# Patient Record
Sex: Female | Born: 1980 | Race: White | Hispanic: Yes | Smoking: Never smoker
Health system: Southern US, Community
[De-identification: ages and names within clinical notes are randomized; demographics above are authoritative.]

## PROBLEM LIST (undated history)

## (undated) DIAGNOSIS — D649 Anemia, unspecified: Secondary | ICD-10-CM

## (undated) DIAGNOSIS — R87629 Unspecified abnormal cytological findings in specimens from vagina: Secondary | ICD-10-CM

## (undated) HISTORY — PX: CERVICAL BIOPSY: SHX590

## (undated) HISTORY — DX: Unspecified abnormal cytological findings in specimens from vagina: R87.629

## (undated) HISTORY — DX: Anemia, unspecified: D64.9

---

## 2004-05-03 LAB — SICKLE CELL SCREEN: Sickle Cell Screen: NORMAL

## 2005-08-22 ENCOUNTER — Emergency Department: Payer: Self-pay | Admitting: Emergency Medicine

## 2006-04-23 ENCOUNTER — Inpatient Hospital Stay: Payer: Self-pay

## 2007-12-10 HISTORY — PX: LEEP: SHX91

## 2008-06-22 ENCOUNTER — Ambulatory Visit: Payer: Self-pay

## 2008-09-14 ENCOUNTER — Ambulatory Visit: Payer: Self-pay

## 2008-10-02 ENCOUNTER — Emergency Department: Payer: Self-pay | Admitting: Emergency Medicine

## 2009-04-10 LAB — OB RESULTS CONSOLE RUBELLA ANTIBODY, IGM: Rubella: IMMUNE

## 2009-04-10 LAB — OB RESULTS CONSOLE VARICELLA ZOSTER ANTIBODY, IGG: Varicella: IMMUNE

## 2009-05-29 ENCOUNTER — Ambulatory Visit: Payer: Self-pay | Admitting: Family Medicine

## 2009-10-25 ENCOUNTER — Observation Stay: Payer: Self-pay

## 2009-10-28 ENCOUNTER — Inpatient Hospital Stay: Payer: Self-pay | Admitting: Obstetrics and Gynecology

## 2011-10-21 ENCOUNTER — Encounter: Payer: Self-pay | Admitting: Obstetrics and Gynecology

## 2012-02-14 ENCOUNTER — Observation Stay: Payer: Self-pay

## 2012-02-14 LAB — CBC WITH DIFFERENTIAL/PLATELET
Basophil %: 0.4 %
Eosinophil %: 4.4 %
HCT: 34.9 % — ABNORMAL LOW (ref 35.0–47.0)
HGB: 11.6 g/dL — ABNORMAL LOW (ref 12.0–16.0)
Lymphocyte #: 3.3 10*3/uL (ref 1.0–3.6)
Lymphocyte %: 26.4 %
MCH: 30.7 pg (ref 26.0–34.0)
MCHC: 33.3 g/dL (ref 32.0–36.0)
MCV: 92 fL (ref 80–100)
Neutrophil #: 7.7 10*3/uL — ABNORMAL HIGH (ref 1.4–6.5)
RBC: 3.78 10*6/uL — ABNORMAL LOW (ref 3.80–5.20)
RDW: 14.1 % (ref 11.5–14.5)

## 2012-02-14 LAB — URINALYSIS, COMPLETE
Glucose,UR: NEGATIVE mg/dL (ref 0–75)
Ketone: NEGATIVE
Leukocyte Esterase: NEGATIVE
Nitrite: NEGATIVE
Ph: 7 (ref 4.5–8.0)
Protein: NEGATIVE
RBC,UR: 1 /HPF (ref 0–5)
Squamous Epithelial: NONE SEEN
WBC UR: 1 /HPF (ref 0–5)

## 2012-02-15 ENCOUNTER — Ambulatory Visit: Payer: Self-pay | Admitting: Internal Medicine

## 2012-02-16 LAB — URINE CULTURE

## 2012-04-24 ENCOUNTER — Inpatient Hospital Stay: Payer: Self-pay | Admitting: Obstetrics & Gynecology

## 2012-04-24 LAB — CBC WITH DIFFERENTIAL/PLATELET
Basophil #: 0.2 10*3/uL — ABNORMAL HIGH (ref 0.0–0.1)
Basophil %: 1.7 %
Eosinophil %: 2.2 %
HCT: 38.5 % (ref 35.0–47.0)
Lymphocyte %: 31.8 %
MCH: 29.5 pg (ref 26.0–34.0)
MCHC: 33.2 g/dL (ref 32.0–36.0)
MCV: 89 fL (ref 80–100)
Monocyte #: 0.6 x10 3/mm (ref 0.2–0.9)
Platelet: 246 10*3/uL (ref 150–440)
RDW: 16.2 % — ABNORMAL HIGH (ref 11.5–14.5)
WBC: 10 10*3/uL (ref 3.6–11.0)

## 2015-04-18 NOTE — H&P (Signed)
L&D Evaluation:  History Expanded:   HPI 34 yo G4P3003 whose EDC = 5/25by 12 week US.  Pt presents with vaginal spotting and cramping since 6 am today.    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  HEENT, CNS, GI, GU, Respiratory, CV, Renal and Musculoskeletal systems were found to be normal.   Exam:   General no apparent distress    Mental Status clear    Chest clear    Heart normal sinus rhythm    Abdomen gravid, non-tender    Estimated Fetal Weight Average for gestational age    Pelvic deferred until after US    FHT normal rate with no decels    Ucx irregular   Impression:   Impression 3rd Trimester Spotting, contraction   Plan:   Comments Hydrate, pewlvic US   Electronic Signatures: Towana Badgerosenow, Bradlee Heitman J (MD)  (Signed 08-Mar-13 13:02)  Authored: L&D Evaluation   Last Updated: 08-Mar-13 13:02 by Towana Badgerosenow, Wiletta Bermingham J (MD)

## 2015-04-18 NOTE — H&P (Signed)
L&D Evaluation:  History Expanded:   HPI 34 yo G4P3003 whose EDC = 04/26/12.  Pt followed at [facility] fopr this pregnancy.  Pt presents in early labor    Blood Type A positive    Group B Strep Results (Result >5wks must be treated as unknown) negative    Maternal HIV Negative    Maternal Syphilis Ab Nonreactive    Rubella Results immune    EDC 26-Apr-2012    Presents with contractions    Patient's Medical History No Chronic Illness    Patient's Surgical History LEEP  (after last child)    Medications Pre [place]atal Vitamins  Tylenol (Acetaminophen)    Allergies NKDA    Social History none    Family History Non-Contributory    Exam:   General no apparent distress    Mental Status clear    Chest clear    Heart normal sinus rhythm    Abdomen gravid, non-tender    Estimated Fetal Weight Average for gestational age    Pelvic 4 cm    Mebranes AROM    Description clear    FHT normal rate with no decels    Ucx regular   Impression:   Impression early labor   Electronic Signatures: Towana Badgerosenow, Landon Bassford J (MD)  (Signed 17-May-13 03:34)  Authored: L&D Evaluation   Last Updated: 17-May-13 03:34 by Towana Badgerosenow, Bryn Perkin J (MD)

## 2016-01-09 ENCOUNTER — Emergency Department: Payer: Self-pay

## 2016-01-09 ENCOUNTER — Emergency Department
Admission: EM | Admit: 2016-01-09 | Discharge: 2016-01-10 | Disposition: A | Discharge status: Home / Self Care | Payer: Self-pay | Attending: Emergency Medicine | Admitting: Emergency Medicine

## 2016-01-09 ENCOUNTER — Encounter: Payer: Self-pay | Admitting: Emergency Medicine

## 2016-01-09 DIAGNOSIS — R200 Anesthesia of skin: Secondary | ICD-10-CM | POA: Insufficient documentation

## 2016-01-09 DIAGNOSIS — R531 Weakness: Secondary | ICD-10-CM | POA: Insufficient documentation

## 2016-01-09 DIAGNOSIS — R51 Headache: Secondary | ICD-10-CM | POA: Insufficient documentation

## 2016-01-09 DIAGNOSIS — H538 Other visual disturbances: Secondary | ICD-10-CM | POA: Insufficient documentation

## 2016-01-09 DIAGNOSIS — R079 Chest pain, unspecified: Secondary | ICD-10-CM | POA: Insufficient documentation

## 2016-01-09 DIAGNOSIS — R519 Headache, unspecified: Secondary | ICD-10-CM

## 2016-01-09 LAB — BASIC METABOLIC PANEL
Anion gap: 5 (ref 5–15)
BUN: 12 mg/dL (ref 6–20)
CO2: 26 mmol/L (ref 22–32)
CREATININE: 0.62 mg/dL (ref 0.44–1.00)
Calcium: 9.1 mg/dL (ref 8.9–10.3)
Chloride: 108 mmol/L (ref 101–111)
GFR calc Af Amer: >60 mL/min (ref >60)
GLUCOSE: 100 mg/dL — AB (ref 65–99)
Potassium: 4.1 mmol/L (ref 3.5–5.1)
SODIUM: 139 mmol/L (ref 135–145)

## 2016-01-09 LAB — CBC
HCT: 37.6 % (ref 35.0–47.0)
Hemoglobin: 12.7 g/dL (ref 12.0–16.0)
MCH: 29.6 pg (ref 26.0–34.0)
MCHC: 33.9 g/dL (ref 32.0–36.0)
MCV: 87.4 fL (ref 80.0–100.0)
PLATELETS: 341 10*3/uL (ref 150–440)
RBC: 4.3 MIL/uL (ref 3.80–5.20)
RDW: 13.8 % (ref 11.5–14.5)
WBC: 8 10*3/uL (ref 3.6–11.0)

## 2016-01-09 LAB — TROPONIN I: Troponin I: <0.03 ng/mL (ref <0.031)

## 2016-01-09 MED ORDER — METOCLOPRAMIDE HCL 5 MG/ML IJ SOLN
10.0000 mg | Freq: Once | INTRAMUSCULAR | Status: AC
  Administered 2016-01-10: 10 mg via INTRAVENOUS
  Filled 2016-01-09: qty 2

## 2016-01-09 MED ORDER — KETOROLAC TROMETHAMINE 30 MG/ML IJ SOLN
30.0000 mg | Freq: Once | INTRAMUSCULAR | Status: AC
  Administered 2016-01-10: 30 mg via INTRAVENOUS
  Filled 2016-01-09: qty 1

## 2016-01-09 MED ORDER — DIPHENHYDRAMINE HCL 50 MG/ML IJ SOLN
25.0000 mg | Freq: Once | INTRAMUSCULAR | Status: AC
  Administered 2016-01-10: 25 mg via INTRAVENOUS
  Filled 2016-01-09: qty 1

## 2016-01-09 NOTE — ED Notes (Addendum)
Patient presents to the ED for intermittent chest pain for several months that patient reports pain as a heaviness.  Patient states that the pain radiates up neck and into her head.  Patient denies shortness of breath and nausea or vomiting.  Patient reports feeling dizzy and lightheaded.  Patient states pain has been becoming more frequent and lasting longer.  Patient states when she feels the pain she lies on her left side and puts a "wasp venom ointment" on her left chest.  Patient has not been seen previously for this pain.  Patient states wasp venom ointment also contains rattlesnake venom.  (Interpreter # [id number])  Patient later reports "cloudy vision" for several days.

## 2016-01-09 NOTE — ED Notes (Addendum)
Pt states "when I comb my hair I can't do it strong enough because my heart hurts." Pt states once pain in chest was so bad that she was not able to stand up when she went to the bathroom. Pt states pain is a pressure and heaviness. Pt states she also has a headache. Pt states N&V yesterday, chills as well.

## 2016-01-09 NOTE — ED Provider Notes (Signed)
[facility] Emergency Department Provider Note  ____________________________________________  Time seen: Approximately 11:28 PM  I have reviewed the triage vital signs and the nursing notes.   HISTORY  Chief Complaint Chest Pain and Cloudy Vision    HPI Ayn Domangue is a 35 y.o. female who comes into the hospital today with headache and chest pain. The patient reports that she feels that whenever she moves her head she feels a pulsating. She reports that she also has some blurred vision. She also has been having some chest pain she reports it's been going on in to admit me for multiple months. The patient reports that the pain is in her left chest. She reports that she has pain whenever she raises her arm in an effort to brush her hair or when she tries to hold herself up. The patient reports that when she makes herself comfortable her pain seems to improve.The patient reports that she's had some nausea in the morning and then she's had pain on and off for the past few months. She also endorses some chills and the pain seemed to be worse in the morning and in the evenings. The patient has taken Tylenol for the pain and she reports that it helps but the pain returns. The patient denies any fever and reports that she has not gotten it checked out for this pain before. The patient does have some dizziness and lightheadedness as well. The patient is here to get checked out. She reports that her headache is 7/10 in intensity, but her chest pain at this time is 0/10   History reviewed. No pertinent past medical history.  There are no active problems to display for this patient.   Past Surgical History  Procedure Laterality Date  . Cervical biopsy      Current Outpatient Rx  Name  Route  Sig  Dispense  Refill  . butalbital-acetaminophen-caffeine (FIORICET) 50-325-40 MG tablet   Oral   Take 1-2 tablets by mouth every 8 (eight) hours as needed for headache.  20 tablet   0     Allergies Review of patient's allergies indicates no known allergies.  No family history on file.  Social History Social History  Substance Use Topics  . Smoking status: Never Smoker   . Smokeless tobacco: None  . Alcohol Use: No    Review of Systems Constitutional: No fever/chills Eyes: No visual changes. ENT: No sore throat. Cardiovascular:  chest pain. Respiratory: Denies shortness of breath. Gastrointestinal: Nausea with No abdominal pain.  no vomiting.  No diarrhea.  No constipation. Genitourinary: Negative for dysuria. Musculoskeletal: Negative for back pain. Skin: Negative for rash. Neurological:  headaches, focal weakness or numbness.  10-point ROS otherwise negative.  ____________________________________________   PHYSICAL EXAM:  VITAL SIGNS: ED Triage Vitals  Enc Vitals Group     BP 01/09/16 1749 131/85 mmHg     Pulse Rate 01/09/16 1749 73     Resp 01/09/16 1749 16     Temp 01/09/16 1749 99.1 F (37.3 C)     Temp Source 01/09/16 1749 Oral     SpO2 01/09/16 1749 100 %     Weight 01/09/16 1749 135 lb (61.236 kg)     Height 01/09/16 1749 4' 11.06" (1.5 m)     Head Cir --      Peak Flow --      Pain Score 01/09/16 1751 2     Pain Loc --      Pain Edu? --  Excl. in GC? --     Constitutional: Alert and oriented. Well appearing and in mild distress. Eyes: Conjunctivae are normal. PERRL. EOMI. Head: Atraumatic. Nose: No congestion/rhinnorhea. Mouth/Throat: Mucous membranes are moist.  Oropharynx non-erythematous. Cardiovascular: Normal rate, regular rhythm. Grossly normal heart sounds.  Good peripheral circulation. Respiratory: Normal respiratory effort.  No retractions. Lungs CTAB. Gastrointestinal: Soft and nontender. No distention. Positive bowel sounds Musculoskeletal: No lower extremity tenderness nor edema.   Neurologic:  Normal speech and language. Radial nerves II through XII are grossly intact with no focal motor or  neuro deficits Skin:  Skin is warm, dry and intact.  Psychiatric: Mood and affect are normal.   ____________________________________________   LABS (all labs ordered are listed, but only abnormal results are displayed)  Labs Reviewed  BASIC METABOLIC PANEL - Abnormal; Notable for the following:    Glucose, Bld 100 (*)    All other components within normal limits  CBC  TROPONIN I  TROPONIN I   ____________________________________________  EKG  ED ECG REPORT I, Rebecka Apley, the attending physician, personally viewed and interpreted this ECG.   Date: 01/09/2016  EKG Time: 1743  Rate: 86  Rhythm: normal sinus rhythm  Axis: normal  Intervals:none  ST&T Change: none  ____________________________________________  RADIOLOGY  CXR: No active cardiopulmonary disease  CT head: No acute intracranial abnormality. ____________________________________________   PROCEDURES  Procedure(s) performed: None  Critical Care performed: No  ____________________________________________   INITIAL IMPRESSION / ASSESSMENT AND PLAN / ED COURSE  Pertinent labs & imaging results that were available during my care of the patient were reviewed by me and considered in my medical decision making (see chart for details).  This is a 35 year old female who comes into the hospital today with some intermittent chest pain as well as some headache today. The patient reports that her headache is that she was worse today than her chest pain. She reports that when she combs her hair uses her arms sometimes she gets this pain in her chest. I will give the patient some Reglan as well as Benadryl and Toradol and send the patient for a CT of her head. I will repeat the patient's heart enzymes and our reassess the patient once I received the results.  The patient's headache is improved with the medication. The patient's repeat troponin is also unremarkable. I did explain this to the patient and  informed her that she should follow back up with her primary care physician for further evaluation of the symptoms. There is a possibility that some of this is musculoskeletal given the fact that she has chest pain when she moves her arms but I will have her follow-up. The patient at this time has no other pain or be discharged home. ____________________________________________   FINAL CLINICAL IMPRESSION(S) / ED DIAGNOSES  Final diagnoses:  Acute nonintractable headache, unspecified headache type  Chest pain, unspecified chest pain type      Rebecka Apley, MD 01/10/16 0246

## 2016-01-09 NOTE — ED Notes (Signed)
Pt transported to CT via stretcher.  

## 2016-01-09 NOTE — ED Notes (Signed)
Discussed need for CT with Dr. Pershing Proud.  Dr. Pershing Proud stated no need to order CT at this time.

## 2016-01-10 LAB — TROPONIN I

## 2016-01-10 MED ORDER — BUTALBITAL-APAP-CAFFEINE 50-325-40 MG PO TABS: 1.0000 | ORAL_TABLET | Freq: Three times a day (TID) | ORAL | Status: AC | PRN

## 2016-01-10 NOTE — Discharge Instructions (Signed)
Dolor de cabeza general sin causa (General Headache Without Cause) El dolor de cabeza es un dolor o malestar que se siente en la zona de la cabeza o del cuello. Puede no tener una causa especfica. Hay muchas causas y tipos de dolores de [place]. Los dolores de cabeza ms comunes son los siguientes:  Cefalea tensional.  Cefaleas migraosas.  Cefalea en brotes.  Cefaleas diarias crnicas. INSTRUCCIONES PARA EL CUIDADO EN EL HOGAR  Controle su afeccin para ver si hay cambios. Siga estos pasos para Scientist, physiological afeccin: Control del [employer] medicamentos de venta libre y los recetados solamente como se lo haya indicado el mdico.  Cuando sienta dolor de cabeza acustese en un cuarto oscuro y tranquilo.  Si se lo indican, aplique hielo sobre la cabeza y la zona del cuello:  Ponga el hielo en una bolsa plstica.  Coloque una toalla entre la piel y la bolsa de hielo.  Coloque el hielo durante , 2 a 3veces por Futures trader.  Utilice una almohadilla trmica o tome una ducha con agua caliente para aplicar calor en la cabeza y la zona del cuello como se lo haya indicado el mdico.  Mantenga las luces tenues si le [employer] luces brillantes o sus dolores de cabeza empeoran. Comida y bebida  Mantenga un horario para las comidas.  Limite el consumo de bebidas alcohlicas.  Consuma menos cantidad de cafena o deje de tomarla. Instrucciones generales  Concurra a todas las visitas de control como se lo haya indicado el mdico. Esto es importante.  Lleve un diario de los dolores de cabeza para Financial risk analyst qu factores pueden desencadenarlos. Por ejemplo, escriba los siguientes datos:  Lo que usted come y Estate agent.  Cunto tiempo duerme.  Algn cambio en su dieta o en los medicamentos.  Pruebe algunas tcnicas de relajacin, como los [place].  Limite el estrs.  Sintese con la espalda recta y no tense los msculos.  No consuma productos que contengan tabaco, incluidos  cigarrillos, tabaco de Theatre manager o cigarrillos electrnicos. Si necesita ayuda para dejar de fumar, consulte al mdico.  Haga actividad fsica habitualmente como se lo haya indicado el mdico.  Tenga un horario fijo para dormir. Duerma entre 7 y 9horas o la cantidad de horas que le haya recomendado el mdico. SOLICITE ATENCIN MDICA SI:   Los medicamentos no Materials engineer los sntomas.  Tiene un dolor de cabeza que es diferente del dolor de cabeza habitual.  Tiene nuseas o vmitos.  Tiene fiebre. SOLICITE ATENCIN MDICA DE INMEDIATO SI:   El dolor se hace cada vez ms intenso.  Ha vomitado repetidas veces.  Presenta rigidez en el cuello.  Sufre prdida de la visin.  Tiene problemas para hablar.  Siente dolor en el ojo o en el odo.  Presenta debilidad muscular o prdida del control muscular.  Pierde el equilibrio o tiene problemas para Advertising account planner.  Sufre mareos o se desmaya.  Se siente confundido.   Esta informacin no tiene Theme park manager el consejo del mdico. Asegrese de hacerle al mdico cualquier pregunta que tenga.   Document Released: 09/04/2005 Document Revised: 08/16/2015 Elsevier Interactive Patient Education 2016 [employer].  Dolor de pecho inespecfico  (Nonspecific Chest Pain) El dolor de pecho puede deberse a muchas enfermedades diferentes. Siempre existe una posibilidad de que el dolor est relacionado con algo grave, como un infarto de miocardio o un cogulo sanguneo en los pulmones. Hay muchas enfermedades que no son potencialmente mortales que pueden causar dolor de [place]. Si  tiene Engineer, mining de [place], es muy importante que se controle con el mdico. CAUSAS  Las causas del dolor de pecho pueden ser las siguientes:  Acidez estomacal.  Neumona o bronquitis.  Ansiedad o estrs.  Inflamacin de la zona que rodea al corazn (pericarditis) o a los pulmones (pleuritis o pleuresa).  Un cogulo sanguneo en el pulmn.  Colapso de un pulmn  (neumotrax), que puede aparecer de [employer] repentina por s solo (neumotrax espontneo) o debido a un traumatismo en el trax.  Culebrilla (virus de la varicela zster).  Infarto de miocardio.  Dao de los [place], los msculos y los cartlagos que conforman la pared torcica. Esto puede incluir lo siguiente:  Hematomas seos debido a lesiones.  Distensiones musculares o de los cartlagos por tos frecuente o repetida, o por exceso de trabajo.  Fractura de una o ms costillas.  Dolor de TEFL teacher debido a inflamacin (costocondritis). FACTORES DE RIESGO  Los factores de riesgo de tener dolor de pecho pueden incluir lo siguiente:  Actividades que incrementan el riesgo de sufrir traumatismos o lesiones en el trax.  Infecciones o enfermedades respiratorias que causan tos frecuente.  Enfermedades o [employer] comidas que pueden causar Engineering geologist.  Enfermedades cardacas o antecedentes familiares de enfermedades cardacas.  Enfermedades o comportamientos de salud que aumentan el riesgo de tener un cogulo sanguneo.  Haber tenido varicela (varicela zster). SIGNOS Y SNTOMAS El dolor de pecho puede provocar las siguientes sensaciones:  Ardor u hormigueo en la superficie o en lo profundo del pecho.  Dolor opresivo, continuo o constrictivo.  Dolor vago o intenso que empeora al [employer], toser o inhalar profundamente.  Dolor que tambin se siente en la espalda, el cuello, el hombro o el brazo, o dolor que se irradia a cualquiera de estas zonas. El dolor de pecho puede aparecer y Geneticist, molecular, o bien puede ser constante. DIAGNSTICO Gretchen Short se necesiten anlisis de laboratorio u otros estudios para Veterinary surgeon causa del Engineer, mining. El mdico puede indicarle que se haga una prueba llamada EGC (electrocadiograma) ambulatorio. El Regulatory affairs officer los patrones de los latidos cardacos en el momento en que se realiza el [place]. Tambin pueden hacerle otros estudios, por  ejemplo:  Ecocardiograma transtorcico (ETT). Durante el ecocardiograma, se usan ondas sonoras para crear una imagen de todas las estructuras cardacas y evaluar cmo circula la sangre por el corazn.  Ecocardiograma transesofgico (ETE).Este es un estudio de diagnstico por imgenes ms avanzado que el obtiene imgenes del interior del cuerpo. Le permite al mdico ver el corazn con mayor detalle.  Monitoreo cardaco. Permite que el mdico controle la frecuencia y el ritmo cardaco en tiempo real.  Monitor Holter. Es un dispositivo porttil que [employer] latidos del corazn y puede ayudar a Education administrator las arritmias cardacas. Le permite al [employer] registrar la actividad cardaca durante varios das, si es necesario.  Pruebas de esfuerzo. Estas pueden realizarse durante el ejercicio o mediante la administracin de un medicamento que acelera los latidos del corazn.  Anlisis de [place].  Diagnstico por imgenes. TRATAMIENTO  El tratamiento depende de la causa del dolor de [place]. El tratamiento puede incluir lo siguiente:  Medicamentos. Estos pueden incluir lo siguiente:  Inhibidores de Publishing copy.  Antiinflamatorios.  Analgsicos para las enfermedades inflamatorias.  Antibiticos, si hay una infeccin.  Medicamentos para [employer].  Medicamentos para tratar la enfermedad arterial coronaria.  Tratamiento complementario para las enfermedades que no requieren la toma de medicamentos. Esto puede incluir lo siguiente:  Descansar.  Aplicar compresas  fras o calientes en las zonas lesionadas.  Limitar las actividades hasta que [employer]. INSTRUCCIONES PARA EL CUIDADO EN EL HOGAR  Si le recetaron antibiticos, asegrese de terminarlos, incluso si comienza a sentirse mejor.  Evite las [employer] causen dolor de [place].  No consuma ningn producto que contenga tabaco, lo que incluye cigarrillos, tabaco de Theatre manager o Soil scientist. Si necesita ayuda para dejar de fumar, consulte al mdico.  No beba alcohol.  Tome los medicamentos solamente como se lo haya indicado el mdico.  Concurra a todas las visitas de control como se lo haya indicado el mdico. Esto es importante. Esto incluye otros estudios si el dolor de pecho no desaparece.  Si la acidez es la causa del dolor de [place], tal vez le aconsejen que mantenga la cabeza levantada (elevada) mientras duerme. Esto reduce la probabilidad de que el cido retroceda del estmago al esfago.  Haga cambios en su estilo de vida como se lo haya indicado el mdico. Estos pueden incluir lo siguiente:  Education administrator actividad fsica con regularidad. Pida al mdico que le sugiera algunas actividades que sean seguras para usted.  Consumir una dieta cardiosaludable. Un nutricionista matriculado puede ayudarlo a Software engineer saludables.  Mantener un peso saludable.  Controlar la diabetes, si es necesario.  Reducir las situaciones de estrs. SOLICITE ATENCIN MDICA SI:  El dolor de pecho no desaparece despus del tratamiento.  Tiene una erupcin cutnea con ampollas en el pecho.  Tiene fiebre. SOLICITE ATENCIN MDICA DE INMEDIATO SI:   El dolor en el pecho es ms intenso.  La tos empeora, o expectora sangre.  Siente un dolor abdominal intenso.  Siente debilidad intensa.  Se desmaya.  Tiene escalofros.  Tiene una molestia repentina e inexplicable en el pecho.  Tiene molestias repentinas e [employer], la espalda, el cuello o la [place].  Le falta el aire en cualquier momento.  Comienza a sudar de [place] repentina o la piel se le humedece.  Siente nuseas o vomita.  Se siente repentinamente mareado o se desmaya.  Siente que el corazn comienza a latir rpidamente o que se saltea latidos. Estos sntomas pueden representar un problema grave que constituye Radio broadcast assistant. No espere hasta que los sntomas desaparezcan. Solicite  atencin mdica de inmediato. Comunquese con el servicio de emergencias de su localidad (911 en los [place]). No conduzca por sus propios medios [employer].   Esta informacin no tiene Theme park manager el consejo del mdico. Asegrese de hacerle al mdico cualquier pregunta que tenga.   Document Released: 11/25/2005 Document Revised: 12/16/2014 Elsevier Interactive Patient Education [employer]

## 2017-02-11 IMAGING — CT CT HEAD W/O CM
2 series · 14 of 30 positions shown, 16 images · non-contrast
Comparison: None.

CLINICAL DATA: Headache and blurry vision.

EXAM:
CT HEAD WITHOUT CONTRAST
TECHNIQUE: Contiguous axial images were obtained from the base of the skull
through the vertex without intravenous contrast.

[Series 2: head wo · axial · 0.38mm/px · z∈[-168,-72]mm · 6 of 27 slices shown, 8 images]
[im 4/27  brain]
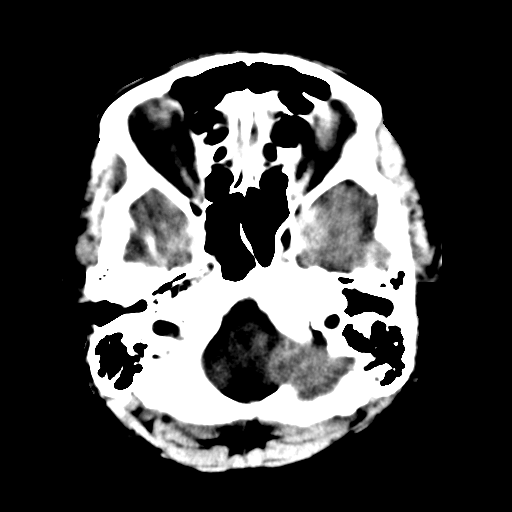
[im 4/27  bone]
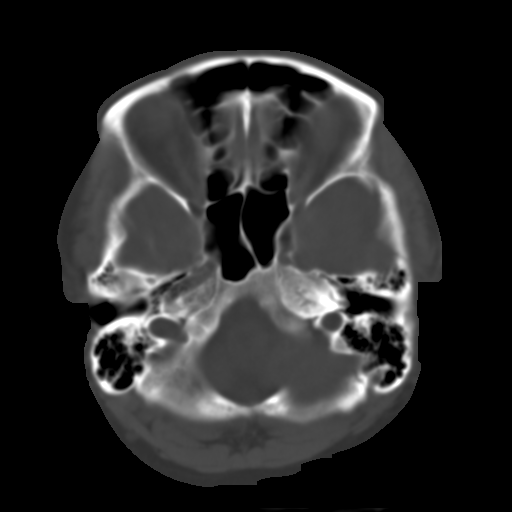
[im 8/27  brain]
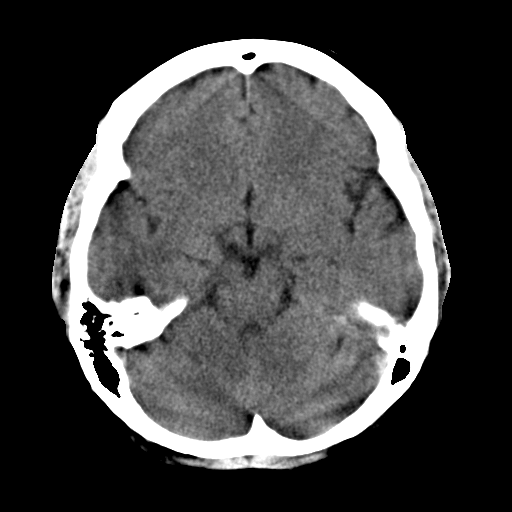
[im 12/27  brain]
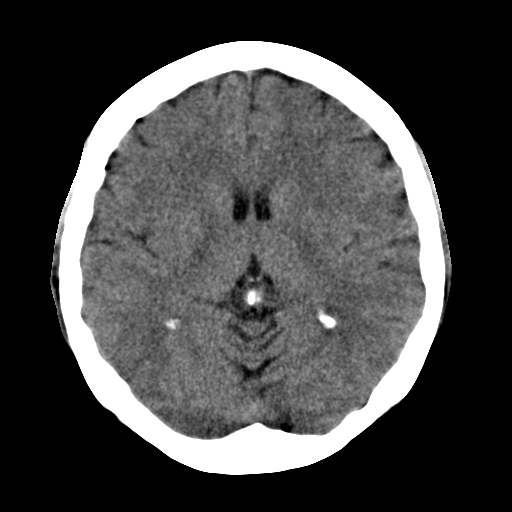
[im 15/27  brain]
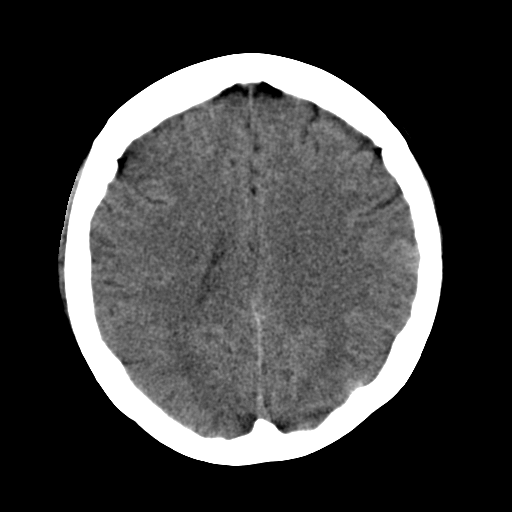
[im 19/27  brain]
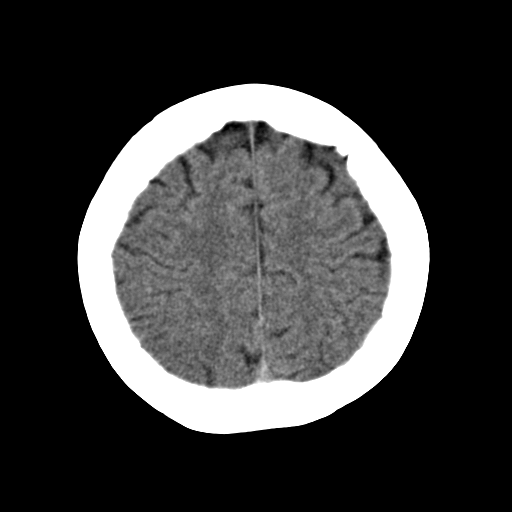
[im 19/27  bone]
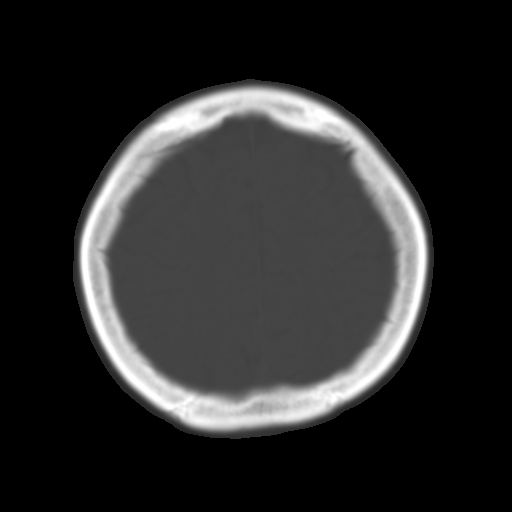
[im 23/27  brain]
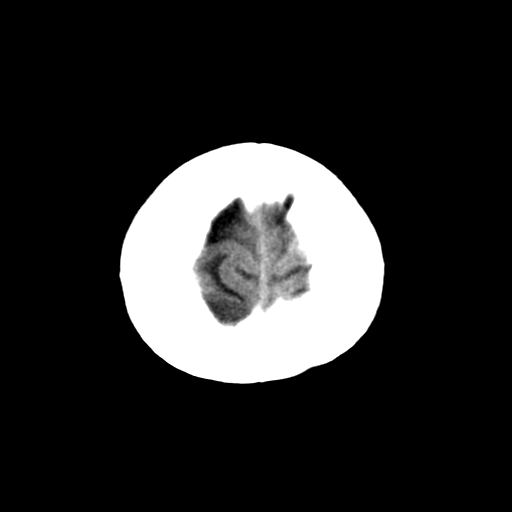

[Series 3: head bone · axial · 0.38mm/px · z∈[-179,-57]mm · 8 of 77 slices shown]
[im 8/77  bone]
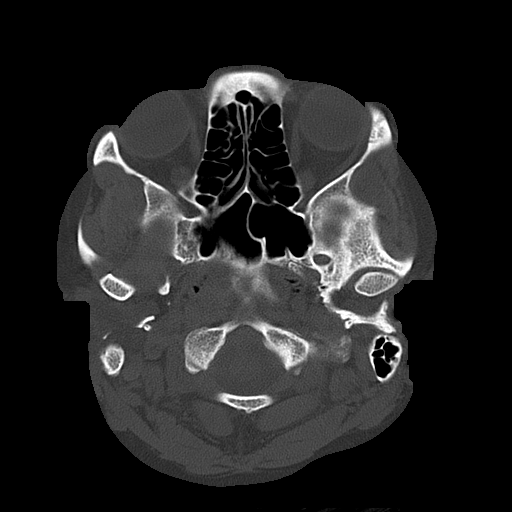
[im 15/77  bone]
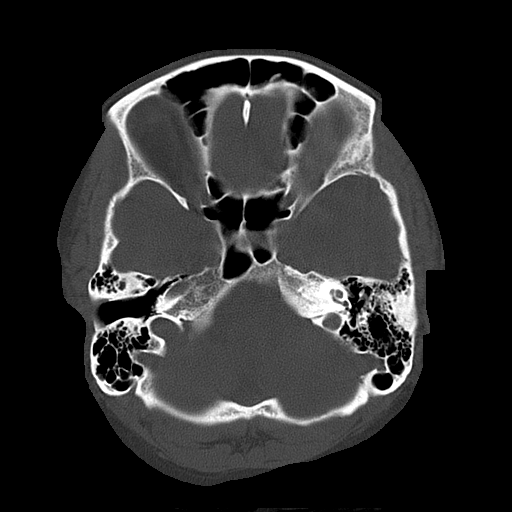
[im 26/77  bone]
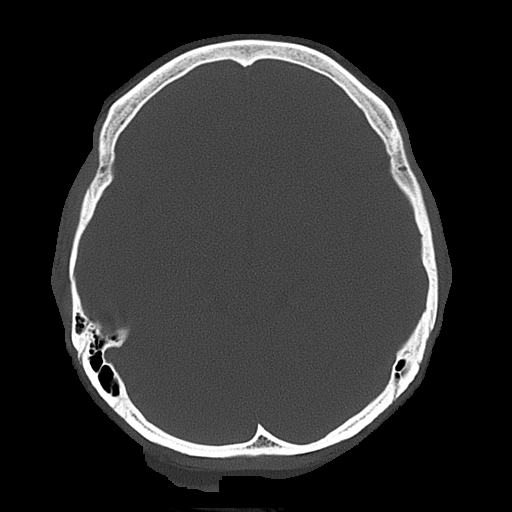
[im 33/77  bone]
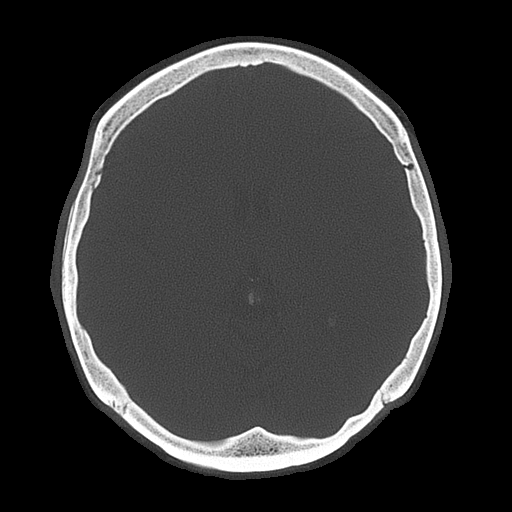
[im 44/77  bone]
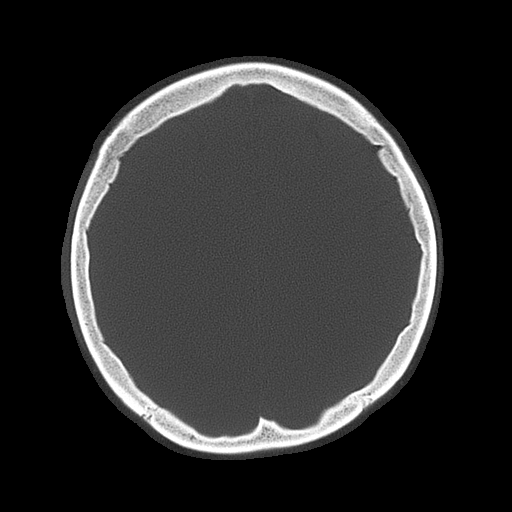
[im 51/77  bone]
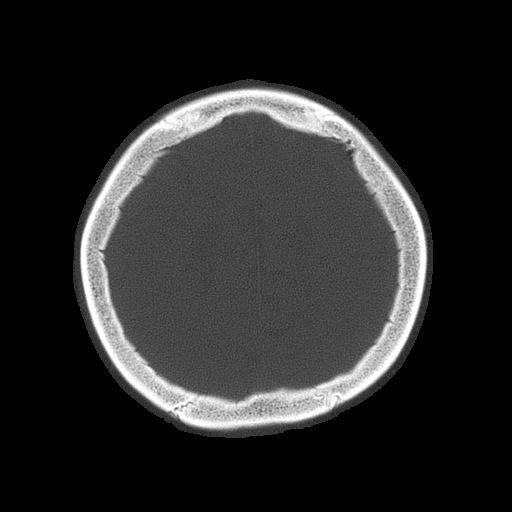
[im 62/77  bone]
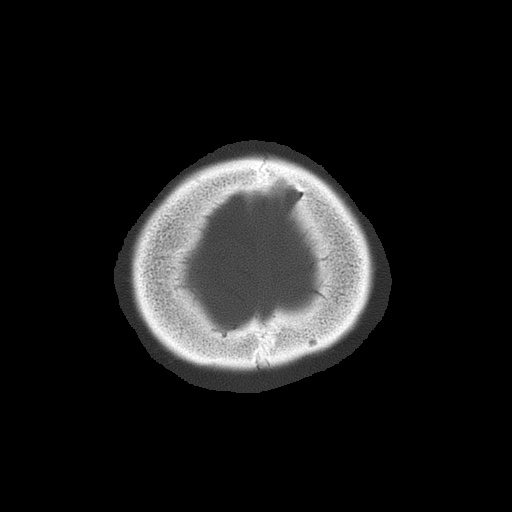
[im 69/77  bone]
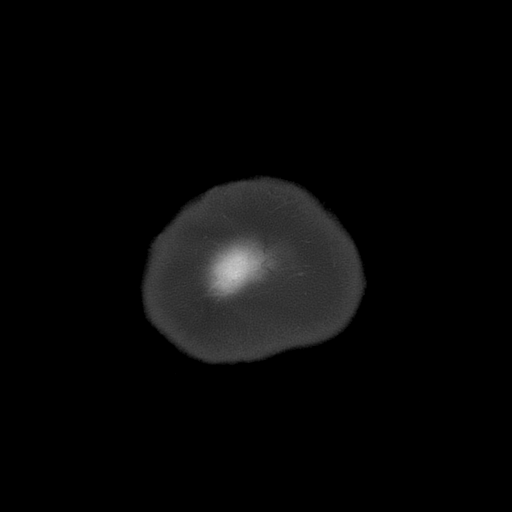

[14 of 30 positions shown; findings below may reference images not displayed]

FINDINGS: Mild streak artifact through the skullbase. No intracranial
hemorrhage, mass effect, or midline shift. No hydrocephalus. The
basilar cisterns are patent. No evidence of territorial infarct. No
intracranial fluid collection. Calvarium is intact. Included
paranasal sinuses and mastoid air cells are well aerated.
IMPRESSION: No acute intracranial abnormality.

## 2017-02-11 IMAGING — CR DG CHEST 2V
2 series · 2 of 2 positions shown · non-contrast
Comparison: None.

CLINICAL DATA: Intermittent chest pain for several months.

EXAM:
CHEST  2 VIEW

[chest pa]
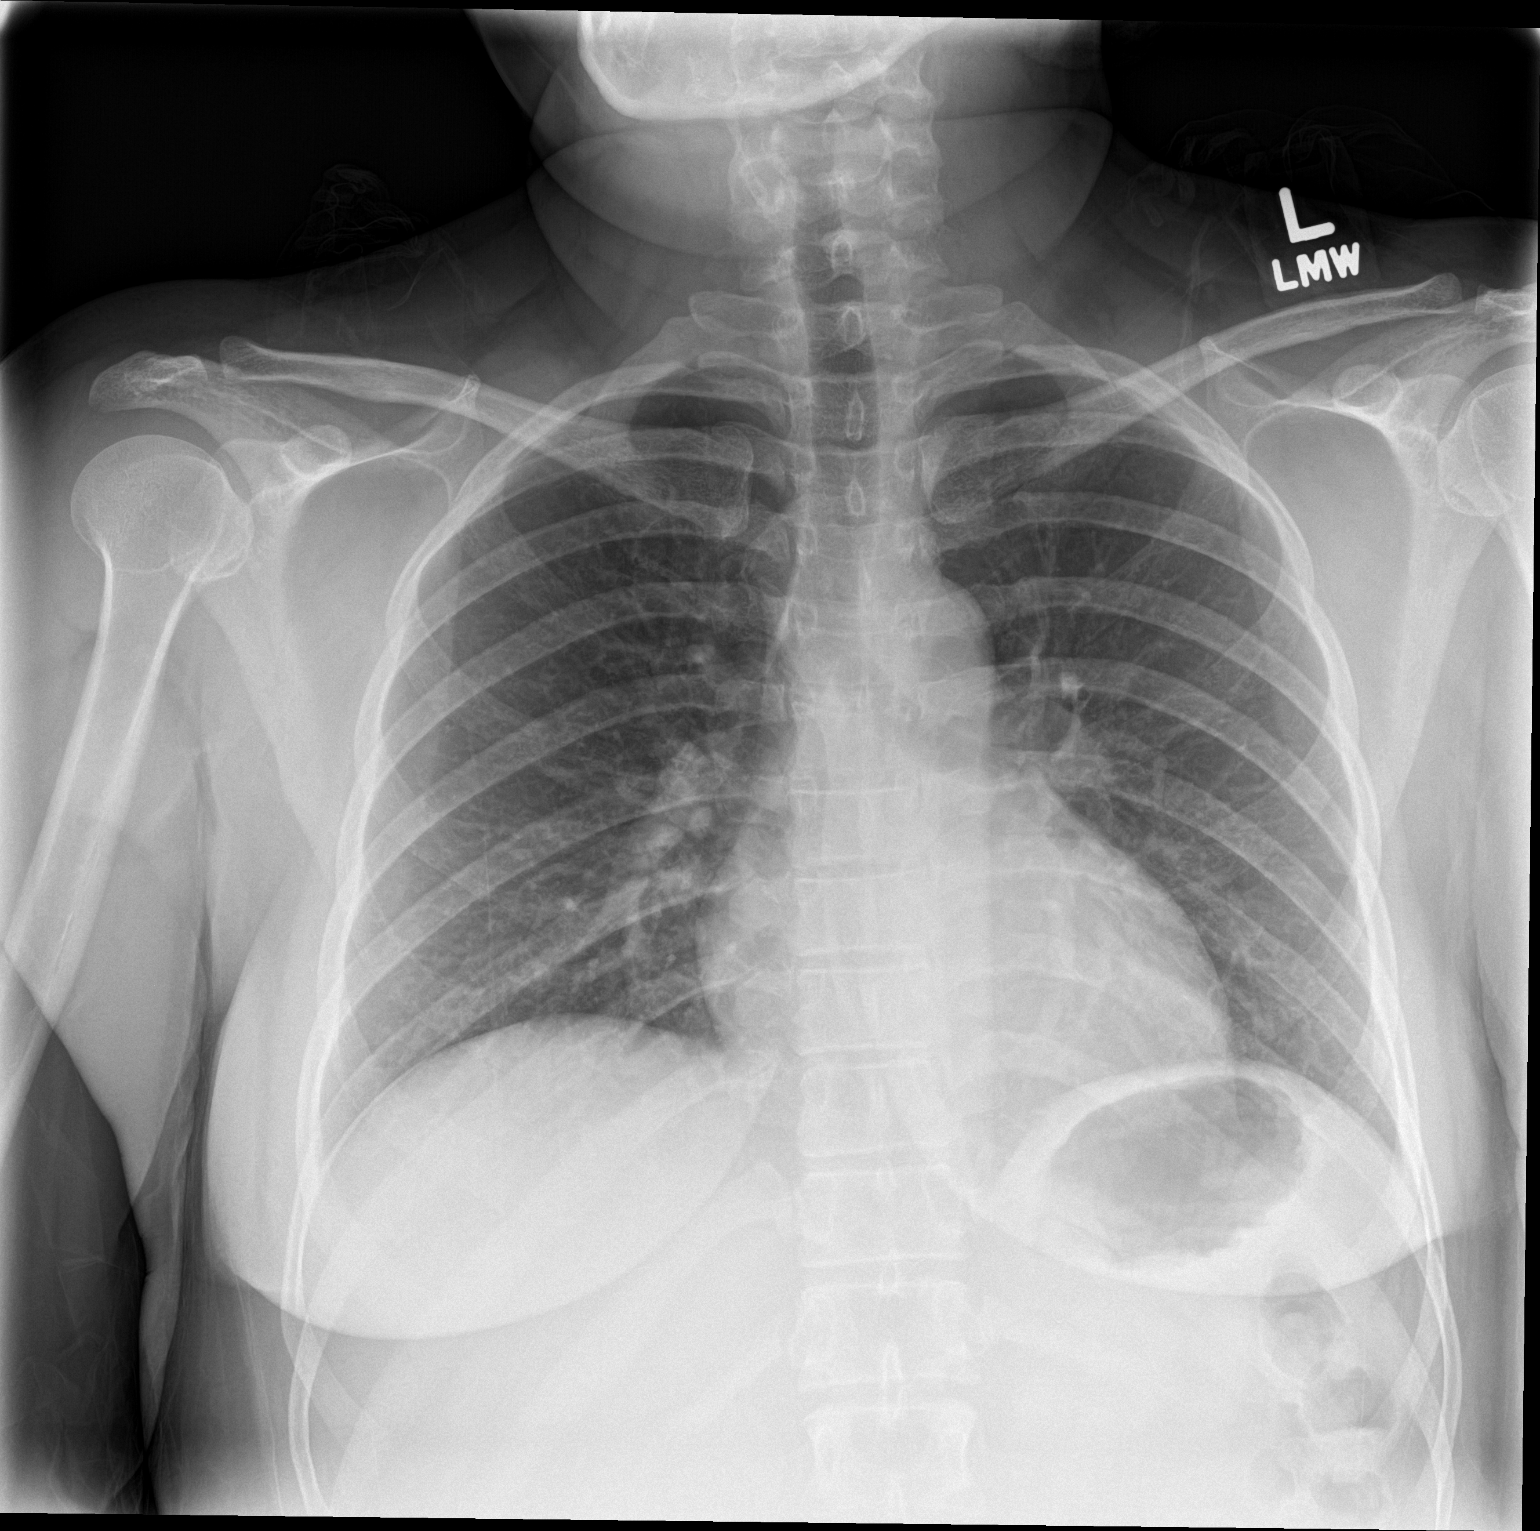

[chest lat]
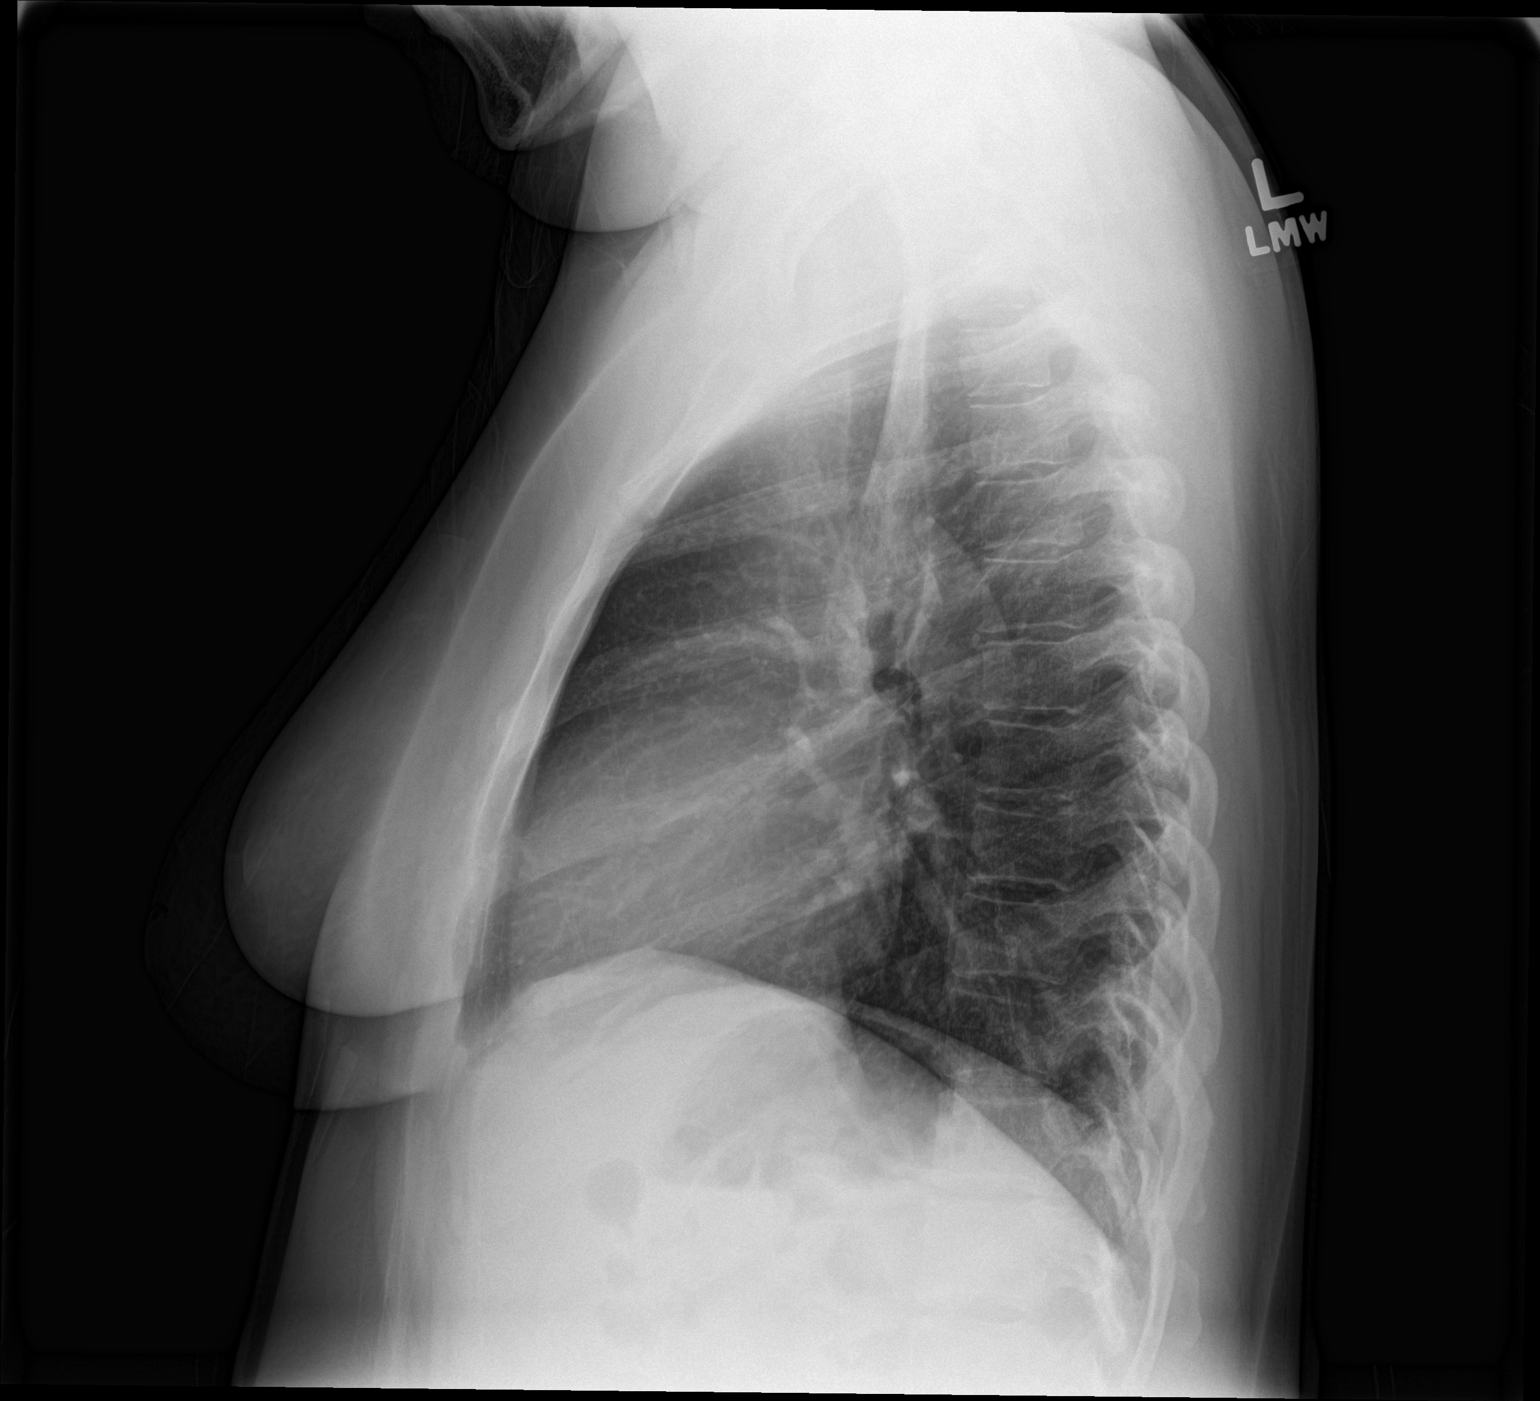

[2 of 2 positions shown; findings below may reference images not displayed]

FINDINGS: The heart size and mediastinal contours are within normal limits.
Both lungs are clear. The visualized skeletal structures are
unremarkable.
IMPRESSION: No active cardiopulmonary disease.

## 2018-12-09 DIAGNOSIS — Z8759 Personal history of other complications of pregnancy, childbirth and the puerperium: Secondary | ICD-10-CM

## 2018-12-09 HISTORY — DX: Personal history of other complications of pregnancy, childbirth and the puerperium: Z87.59

## 2018-12-09 NOTE — L&D Delivery Note (Signed)
Delivery Note  First Stage: Labor onset: 11/25 at 2100 Augmentation : AROM Analgesia Lorriane Shire intrapartum: IVPM- Fentanyl x 2 doses AROM at 0958   Second Stage: Complete dilation at 1338 Onset of pushing at 1338 FHR second stage: variable decel to 90s   Delivery of a viable female on 11/04/19 at 1342 by CNM delivery of fetal head in ROA position with restitution to ROT. Tight nuchal cord x 2;  Anterior then posterior shoulders delivered easily with gentle downward traction. Baby placed on mom's chest, and attended to by peds.  Cord double clamped after cessation of pulsation, cut by FOB  Third Stage: Placenta delivered spontaneously intact with 3VC @ 1347; pitocin started at delivery of infant, active third stage mgmt. Uterine tone boggy, firmed with massage, multiple 2-4cm clots expressed. Bleeding brisk then slowed after massage. Cytotec 869mcg PR given. Noted increased bleeding again massaged firm, Methergine 0.2mg  IM given.  Placenta disposition: routine disposal   1st deg perineal laceration identified  Anesthesia for repair: local Repair 3-0 Vicryl SH  Est. Blood Loss (mL): [id number]  Complications: PP hemorrhage  Mom to postpartum.  Baby to Couplet care / Skin to Skin.  Newborn: Birth Weight: pending  Apgar Scores: 8/9 Feeding planned: breast

## 2019-05-06 LAB — OB RESULTS CONSOLE RUBELLA ANTIBODY, IGM: Rubella: IMMUNE

## 2019-05-06 LAB — OB RESULTS CONSOLE GC/CHLAMYDIA
Chlamydia: NEGATIVE
Gonorrhea: NEGATIVE

## 2019-05-06 LAB — OB RESULTS CONSOLE RPR: RPR: NONREACTIVE

## 2019-05-06 LAB — OB RESULTS CONSOLE VARICELLA ZOSTER ANTIBODY, IGG: Varicella: IMMUNE

## 2019-05-06 LAB — OB RESULTS CONSOLE ABO/RH: RH Type: POSITIVE

## 2019-05-06 LAB — OB RESULTS CONSOLE HIV ANTIBODY (ROUTINE TESTING): HIV: NONREACTIVE

## 2019-05-06 LAB — SICKLE CELL SCREEN: Sickle Cell Screen: NORMAL

## 2019-05-06 LAB — OB RESULTS CONSOLE PLATELET COUNT: Platelets: 351

## 2019-05-06 LAB — OB RESULTS CONSOLE HGB/HCT, BLOOD: Hemoglobin: 11.6

## 2019-05-06 LAB — OB RESULTS CONSOLE ANTIBODY SCREEN: Antibody Screen: NEGATIVE

## 2019-05-06 LAB — OB RESULTS CONSOLE HEPATITIS B SURFACE ANTIGEN: Hepatitis B Surface Ag: NEGATIVE

## 2019-05-07 ENCOUNTER — Other Ambulatory Visit: Payer: Self-pay | Admitting: Nurse Practitioner

## 2019-05-07 DIAGNOSIS — Z369 Encounter for antenatal screening, unspecified: Secondary | ICD-10-CM

## 2019-05-13 ENCOUNTER — Other Ambulatory Visit: Payer: Self-pay

## 2019-05-13 ENCOUNTER — Telehealth: Payer: Self-pay | Admitting: Obstetrics and Gynecology

## 2019-05-13 ENCOUNTER — Ambulatory Visit: Admission: RE | Admit: 2019-05-13 | Payer: Self-pay | Source: Ambulatory Visit

## 2019-05-13 NOTE — Telephone Encounter (Signed)
Tried to reach patient three times with interpreter to provide genetic counseling by telephone as scheduled. The first number we had was her husband's number [phone]) and he gave [place] the correct number for her [phone]).Left a message for her to contact us prior to her ultrasound appointment at [facility] on Monday 6/8 to offer option of telephone genetic counseling prior to the visit.  Cherly Anderson, MS, CGC   Addendum:  The patient called back and we were able to schedule her genetic counseling at 8am on 05/17/19 by telephone with her ultrasound at 11am at [facility] that same day.

## 2019-05-17 ENCOUNTER — Ambulatory Visit
Admission: RE | Admit: 2019-05-17 | Discharge: 2019-05-17 | Disposition: A | Discharge status: Home / Self Care | Payer: Self-pay | Source: Ambulatory Visit | Attending: Maternal & Fetal Medicine | Admitting: Maternal & Fetal Medicine

## 2019-05-17 ENCOUNTER — Ambulatory Visit (HOSPITAL_BASED_OUTPATIENT_CLINIC_OR_DEPARTMENT_OTHER)
Admission: RE | Admit: 2019-05-17 | Discharge: 2019-05-17 | Disposition: A | Discharge status: Home / Self Care | Payer: Self-pay | Source: Ambulatory Visit | Attending: Maternal & Fetal Medicine | Admitting: Maternal & Fetal Medicine

## 2019-05-17 ENCOUNTER — Other Ambulatory Visit: Payer: Self-pay

## 2019-05-17 ENCOUNTER — Ambulatory Visit: Payer: Self-pay

## 2019-05-17 DIAGNOSIS — Z369 Encounter for antenatal screening, unspecified: Secondary | ICD-10-CM | POA: Insufficient documentation

## 2019-05-17 DIAGNOSIS — Z3A16 16 weeks gestation of pregnancy: Secondary | ICD-10-CM | POA: Insufficient documentation

## 2019-05-17 DIAGNOSIS — O09521 Supervision of elderly multigravida, first trimester: Secondary | ICD-10-CM

## 2019-05-17 NOTE — Progress Notes (Signed)
Hx reviewed.  Agree with assessment and plan as outlined in [place] note.

## 2019-05-17 NOTE — Progress Notes (Addendum)
Virtual Visit via Telephone Note  I connected with Kimberly Cherry on May 17, 2019 at  9:00 AM EDT by telephone and verified that I am speaking with the correct person using two identifiers. A Spanish interpreter was utilized.   Referring Provider:  [facility] Length of Consultation: 40 minutes  Ms. Julaine Huaamos Cherry was referred to [facility] for genetic counseling because of advanced maternal age.  The patient will be 38 years old at the time of delivery.  This note summarizes the information we discussed with the aid of a Spanish interpreter.    We explained that the chance of a chromosome abnormality increases with maternal age.  Chromosomes and examples of chromosome problems were reviewed.  Humans typically have 46 chromosomes in each cell, with half passed through each sperm and egg.  Any change in the number or structure of chromosomes can increase the risk of problems in the physical and mental development of a pregnancy.   Based upon age of the patient, the chance of any chromosome abnormality was 1 in 3387. The chance of Down syndrome, the most common chromosome problem associated with maternal age, was 1 in 61175.  The risk of chromosome problems is in addition to the 3% general population risk for birth defects and mental retardation.  The greatest chance, of course, is that the baby would be born in good health.  We discussed the following prenatal screening and testing options for this pregnancy:  Cell free fetal DNA testing from maternal blood may be used to determine whether or not the baby is at high risk for Down syndrome, trisomy 8613, or trisomy 5918.  This test utilizes a maternal blood sample and DNA sequencing technology to isolate circulating cell free fetal DNA from maternal plasma.  The fetal DNA can then be analyzed for DNA sequences that are derived from the three most common chromosomes involved in aneuploidy, chromosomes  13, 18, and 21.  If the overall amount of DNA is greater than the expected level for any of these chromosomes, aneuploidy is suspected.  While we do not consider it a replacement for invasive testing and karyotype analysis, a negative result from this testing would be reassuring, though not a guarantee of a normal chromosome complement for the baby.  An abnormal result is certainly suggestive of an abnormal chromosome complement, though we would still recommend CVS or amniocentesis to confirm any findings from this testing.  First trimester screening, which includes nuchal translucency ultrasound screen and first trimester maternal serum marker screening.  The nuchal translucency has approximately an 80% detection rate for Down syndrome and can be positive for other chromosome abnormalities as well as heart defects.  When combined with a maternal serum marker screening, the detection rate is up to 90% for Down syndrome and up to 97% for trisomy 18.   Due to COVID restrictions, we are only offering the biochemical part of this testing at this time, which has a significantly reduced detection rate.  The chorionic villus sampling procedure is available for first trimester chromosome analysis.  This involves the withdrawal of a small amount of chorionic villi (tissue from the developing placenta).  Risk of pregnancy loss is estimated to be approximately 1 in 200 to 1 in 100 (0.5 to 1%).  There is approximately a 1% (1 in 100) chance that the CVS chromosome results will be unclear.  Chorionic villi cannot be tested for neural tube defects.     Maternal  serum marker screening, a blood test that measures pregnancy proteins, can provide risk assessments for Down syndrome, trisomy 18, and open neural tube defects (spina bifida, anencephaly). Because it does not directly examine the fetus, it cannot positively diagnose or rule out these problems.  Targeted ultrasound uses high frequency sound waves to create an image  of the developing fetus.  An ultrasound is often recommended as a routine means of evaluating the pregnancy.  It is also used to screen for fetal anatomy problems (for example, a heart defect) that might be suggestive of a chromosomal or other abnormality.   Amniocentesis involves the removal of a small amount of amniotic fluid from the sac surrounding the fetus with the use of a thin needle inserted through the maternal abdomen and uterus.  Ultrasound guidance is used throughout the procedure.  Fetal cells from amniotic fluid are directly evaluated and > 99.5% of chromosome problems and > 98% of open neural tube defects can be detected. This procedure is generally performed after the 15th week of pregnancy.  The main risks to this procedure include complications leading to miscarriage in less than 1 in 200 cases (0.5%).  Cystic Fibrosis and Spinal Muscular Atrophy (SMA) screening were also discussed with the patient. Both conditions are recessive, which means that both parents must be carriers in order to have a child with the disease.  Cystic fibrosis (CF) is one of the most common genetic conditions in persons of Caucasian ancestry.  This condition occurs in approximately 1 in 2,500 Caucasian persons and results in thickened secretions in the lungs, digestive, and reproductive systems.  For a baby to be at risk for having CF, both of the parents must be carriers for this condition.  Approximately 1 in 6625 Caucasian persons is a carrier for CF.  Current carrier testing looks for the most common mutations in the gene for CF and can detect approximately 90% of carriers in the Caucasian population.  This means that the carrier screening can greatly reduce, but cannot eliminate, the chance for an individual to have a child with CF.  If an individual is found to be a carrier for CF, then carrier testing would be available for the partner. As part of [place]'s newborn screening profile, all babies born in the  state of [place] will have a two-tier screening process.  Specimens are first tested to determine the concentration of immunoreactive trypsinogen (IRT).  The top 5% of specimens with the highest IRT values then undergo DNA testing using a panel of over 40 common CF mutations. SMA is a neurodegenerative disorder that leads to atrophy of skeletal muscle and overall weakness.  This condition is also more prevalent in the Caucasian population, with 1 in 40-1 in 60 persons being a carrier and 1 in 6,000-1 in 10,000 children being affected.  There are multiple forms of the disease, with some causing death in infancy to other forms with survival into adulthood.  The genetics of SMA is complex, but carrier screening can detect up to 95% of carriers in the Caucasian population.  Similar to CF, a negative result can greatly reduce, but cannot eliminate, the chance to have a child with SMA. We also reviewed the option of carrier screening for hemoglobinopathies. A review of her records from [facility] shows normal hemoglobinopathy screening.  We obtained a detailed family history and pregnancy history.  The family history is unremarkable for birth defects, developmental delays, recurrent pregnancy loss or known chromosome abnormalities.   Ms.  Ramos Cherry stated that this is her fifth pregnancy, the fourth with her current partner.  She reported no complications or exposures in this pregnancy that would be expected to increase the risk for birth defects.  Her partner is 35 years old.  Advanced paternal age is known to be associated with an increased chance for several fetal health conditions.  Therefore, we also reviewed the issues surrounding advanced paternal age.  There is known to be an increase in single gene abnormalities in the children of men who are over age 43-50, though the specific age varies in different studies.  This chance is estimated to be no greater than 2%. These mutations may result in birth  defects or genetic syndromes which may show differences on ultrasound in the second trimester.  Therefore, we recommend a detailed anatomy ultrasound at approximately [redacted] weeks gestation. A normal ultrasound cannot rule out these disorders. Advanced paternal age has also been associated with other conditions including autism spectrum disorders, schizophrenia and childhood acute lymphoblastic leukemia.  It is important to be aware of these associations, though no clinical testing is available for these conditions at this time.    After consideration of the options, Ms. Ramos Cherry elected to proceed with an ultrasound and MaterniT21 testing to be drawn at her visit to [facility] on 05/17/19. An ultrasound will be performed at her clinic visit on 05/17/19.  She declined carrier screening for CF and SMA.  Ms. Logan Bores was encouraged to call with questions or concerns.  We can be contacted at [phone].   Tests Ordered: MaterniT21 PLUS with [facility]  I provided 40 minutes of non-face-to-face time during this encounter.   Donette Larry

## 2019-05-21 LAB — MATERNIT21 PLUS CORE+SCA
Fetal Fraction: 11
Monosomy X (Turner Syndrome): NOT DETECTED
Result (T21): NEGATIVE
Trisomy 13 (Patau syndrome): NEGATIVE
Trisomy 18 (Edwards syndrome): NEGATIVE
Trisomy 21 (Down syndrome): NEGATIVE
XXX (Triple X Syndrome): NOT DETECTED
XXY (Klinefelter Syndrome): NOT DETECTED
XYY (Jacobs Syndrome): NOT DETECTED

## 2019-05-31 ENCOUNTER — Telehealth: Payer: Self-pay | Admitting: Obstetrics and Gynecology

## 2019-05-31 NOTE — Telephone Encounter (Signed)
Calling to give normal lab results, left message using Spanish interpreter to call back for results.  Also left a similar message on Thursday, 6/18.  We may be reached at [phone].  Wilburt Finlay, MS, CGC

## 2019-06-03 ENCOUNTER — Other Ambulatory Visit: Payer: Self-pay

## 2019-06-03 ENCOUNTER — Telehealth: Payer: Self-pay | Admitting: Obstetrics and Gynecology

## 2019-06-03 DIAGNOSIS — O09521 Supervision of elderly multigravida, first trimester: Secondary | ICD-10-CM

## 2019-06-03 NOTE — Telephone Encounter (Signed)
The patient was informed of the results of her recent MaterniT21 testing which yielded NEGATIVE results.  The patient's specimen showed DNA consistent with two copies of chromosomes 21, 18 and 13.  The sensitivity for trisomy 77, trisomy 66 and trisomy 3 using this testing are reported as 99.1%, 99.9% and 91.7% respectively.  Thus, while the results of this testing are highly accurate, they are not considered diagnostic at this time.  Should more definitive information be desired, the patient may still consider amniocentesis.   As requested to know by the patient, sex chromosome analysis was included for this sample.  Results are consistent with a female fetus (Y chromosome material present). This is predicted with >99% accuracy.  A maternal serum AFP only should be considered if screening for neural tube defects is desired.  We may be reached at [phone] with any questions or concerns.   Wilburt Finlay, MS, CGC

## 2019-06-07 ENCOUNTER — Other Ambulatory Visit: Payer: Self-pay

## 2019-06-07 ENCOUNTER — Ambulatory Visit
Admission: RE | Admit: 2019-06-07 | Discharge: 2019-06-07 | Disposition: A | Discharge status: Home / Self Care | Payer: MEDICAID | Source: Ambulatory Visit | Attending: Obstetrics and Gynecology | Admitting: Obstetrics and Gynecology

## 2019-06-07 VITALS — BP 107/77 | HR 71 | Temp 97.7°F | Resp 22 | Ht 60.0 in | Wt 151.5 lb

## 2019-06-07 DIAGNOSIS — O4442 Low lying placenta NOS or without hemorrhage, second trimester: Secondary | ICD-10-CM | POA: Diagnosis present

## 2019-06-07 DIAGNOSIS — O09521 Supervision of elderly multigravida, first trimester: Secondary | ICD-10-CM | POA: Diagnosis present

## 2019-06-07 DIAGNOSIS — O4440 Low lying placenta NOS or without hemorrhage, unspecified trimester: Secondary | ICD-10-CM

## 2019-06-21 ENCOUNTER — Other Ambulatory Visit: Payer: Self-pay | Admitting: Family Medicine

## 2019-06-21 ENCOUNTER — Encounter: Payer: Self-pay | Admitting: Family Medicine

## 2019-06-21 DIAGNOSIS — O2342 Unspecified infection of urinary tract in pregnancy, second trimester: Secondary | ICD-10-CM

## 2019-06-21 DIAGNOSIS — Z8742 Personal history of other diseases of the female genital tract: Secondary | ICD-10-CM

## 2019-06-21 DIAGNOSIS — O4442 Low lying placenta NOS or without hemorrhage, second trimester: Secondary | ICD-10-CM

## 2019-06-21 DIAGNOSIS — O0990 Supervision of high risk pregnancy, unspecified, unspecified trimester: Secondary | ICD-10-CM

## 2019-06-21 DIAGNOSIS — E663 Overweight: Secondary | ICD-10-CM | POA: Insufficient documentation

## 2019-06-21 LAB — OB RESULTS CONSOLE VARICELLA ZOSTER ANTIBODY, IGG: Varicella: IMMUNE

## 2019-06-21 LAB — OB RESULTS CONSOLE RUBELLA ANTIBODY, IGM: Rubella: IMMUNE

## 2019-06-29 ENCOUNTER — Telehealth: Payer: Self-pay

## 2019-06-29 NOTE — Telephone Encounter (Signed)
TC to patient to reschedule 7/22 maternity appointment. There will not be a provider available after 2:30pm tomorrow. Patient scheduled for 4:00pm. Left message for patient to call back as soon as possible. Interpreter V. [place].Jenetta Downer, RN

## 2019-06-30 ENCOUNTER — Ambulatory Visit: Payer: Self-pay

## 2019-07-06 ENCOUNTER — Other Ambulatory Visit: Payer: Self-pay

## 2019-07-06 ENCOUNTER — Ambulatory Visit: Payer: Self-pay | Admitting: Nurse Practitioner

## 2019-07-06 DIAGNOSIS — O0990 Supervision of high risk pregnancy, unspecified, unspecified trimester: Secondary | ICD-10-CM

## 2019-07-06 MED ORDER — PRENATAL VITAMIN PLUS LOW IRON 27-1 MG PO TABS: 1.0000 | ORAL_TABLET | Freq: Every day | ORAL | 0 refills | Status: DC

## 2019-07-06 NOTE — Progress Notes (Signed)
In for visit; reports has follow-up u/s appt: 08/20/19 for placenta location; taking PNV-more given today;CCNC & PHQ9 today.

## 2019-07-06 NOTE — Progress Notes (Signed)
   PRENATAL VISIT NOTE  Subjective:  Kimberly Cherry is a 38 y.o. [id number] at [redacted]w[redacted]d being seen today for ongoing prenatal care.  She is currently monitored for the following issues for this high-risk pregnancy and has Advanced maternal age in multigravida, first trimester; Low lying placenta nos or without hemorrhage, second trimester; Supervision of high risk pregnancy, antepartum; UTI in pregnancy, second trimester; History of abnormal cervical Pap smear; and Overweight (BMI 25.0-29.9) on their problem list.  Patient reports no complaints.  Contractions: Not present. Vag. Bleeding: None.  Movement: Present. Denies leaking of fluid/ROM.   The following portions of the patient's history were reviewed and updated as appropriate: allergies, current medications, past family history, past medical history, past social history, past surgical history and problem list. Problem list updated.  Objective:   Vitals:   07/06/19 1543  BP: 100/67  Temp: 99.3 F (37.4 C)  Weight: 151 lb (68.5 kg)    Fetal Status: Fetal Heart Rate (bpm): 150 Fundal Height: 22 cm Movement: Present     General:  Alert, oriented and cooperative. Patient is in no acute distress.  Skin: Skin is warm and dry. No rash noted.   Cardiovascular: Normal heart rate noted  Respiratory: Normal respiratory effort, no problems with respiration noted  Abdomen: Soft, gravid, appropriate for gestational age.  Pain/Pressure: Absent     Pelvic: Cervical exam deferred        Extremities: Normal range of motion.  Edema: None  Mental Status: Normal mood and affect. Normal behavior. Normal judgment and thought content.   Assessment and Plan:  Pregnancy: [id number] at [redacted]w[redacted]d  1. Supervision of high risk pregnancy, antepartum Client doing well Denies any headaches or vaginal discharge.  Denies any complaints at this time Client verbalizes understanding and is in agreement with plan of care     Preterm labor symptoms and general  obstetric precautions including but not limited to vaginal bleeding, contractions, leaking of fluid and fetal movement were reviewed in detail with the patient. Please refer to After Visit Summary for other counseling recommendations.  Return in about 4 weeks (around 08/03/2019) for routine prenatal care.  Future Appointments  Date Time Provider [place]  08/04/2019  4:00 PM [facility] PROVIDER [facility] None  08/23/2019 11:00 AM [facility] [place] [facility]    Berniece Andreas, NP

## 2019-08-04 ENCOUNTER — Telehealth: Payer: Self-pay

## 2019-08-04 ENCOUNTER — Ambulatory Visit: Payer: Self-pay

## 2019-08-04 ENCOUNTER — Ambulatory Visit: Payer: Self-pay | Admitting: Advanced Practice Midwife

## 2019-08-04 ENCOUNTER — Other Ambulatory Visit: Payer: Self-pay

## 2019-08-04 DIAGNOSIS — O4442 Low lying placenta NOS or without hemorrhage, second trimester: Secondary | ICD-10-CM

## 2019-08-04 DIAGNOSIS — O0990 Supervision of high risk pregnancy, unspecified, unspecified trimester: Secondary | ICD-10-CM

## 2019-08-04 NOTE — Progress Notes (Signed)
   TELEPHONE OBSTETRICS VISIT ENCOUNTER NOTE  I connected with Kimberly Cherry @ on 08/04/19 at  4:00 PM EDT by telephone at home and verified that I am speaking with the correct person using two identifiers.   I discussed the limitations, risks, security and privacy concerns of performing an evaluation and management service by telephone and the availability of in person appointments. I also discussed with the patient that there may be a patient responsible charge related to this service. The patient expressed understanding and agreed to proceed.  Subjective:  Kimberly Cherry is a 38 y.o. [id number] at [redacted]w[redacted]d being followed for ongoing prenatal care.  She is currently monitored for the following issues for this high-risk pregnancy and has Advanced maternal age in multigravida, first trimester; Low lying placenta nos or without hemorrhage, second trimester; Supervision of high risk pregnancy, antepartum; UTI in pregnancy, second trimester; History of abnormal cervical Pap smear; and Overweight (BMI 25.0-29.9) on their problem list.  Patient reports white vaginal discharge without itching or burning. Reports fetal movement. Denies any contractions, bleeding or leaking of fluid.   The following portions of the patient's history were reviewed and updated as appropriate: allergies, current medications, past family history, past medical history, past social history, past surgical history and problem list.   Objective:   General:  Alert, oriented and cooperative.   Mental Status: Normal mood and affect perceived. Normal judgment and thought content.  Rest of physical exam deferred due to type of encounter  Assessment and Plan:  Pregnancy: G5P4004 at [redacted]w[redacted]d 1. Supervision of high risk pregnancy, antepartum Feels well except white discharge without odor or itching.  Pt reassured.  Needs 28 wk labs at next appt on 08/18/19  2. Low lying placenta nos or without hemorrhage, second  trimester Diagnosed on 06/07/19 u/s.  Needs repeat u/s 28-32 wks--has appt 08/23/19  Preterm labor symptoms and general obstetric precautions including but not limited to vaginal bleeding, contractions, leaking of fluid and fetal movement were reviewed in detail with the patient.  I discussed the assessment and treatment plan with the patient. The patient was provided an opportunity to ask questions and all were answered. The patient agreed with the plan and demonstrated an understanding of the instructions. The patient was advised to call back or seek an in-person office evaluation/go to the hospital for any urgent or concerning symptoms.  Please refer to After Visit Summary for other counseling recommendations.   I provided 11 minutes of non-face-to-face time during this encounter.  Return in about 2 weeks (around 08/18/2019) for 28 wk labs, needs f/u u/s 28-32 wks.  Future Appointments  Date Time Provider [place]  08/18/2019  9:00 AM [facility] PROVIDER [facility] None  08/23/2019 11:00 AM [facility] [place] [facility]    Herbie Saxon, CNM

## 2019-08-04 NOTE — Progress Notes (Signed)
Call to client for  [place] visit; confirmed pt. Identity, discussed limitations of visit, and informed is a billable visit-agrees to continue; taking PNV; reviewed s/s PTL; reminded of 08/23/19 u/s appt; c/o whitish discharge but denies vaginal itching or odor; reviewed labs needed @ next visit Debera Lat, RN

## 2019-08-04 NOTE — Telephone Encounter (Signed)
Attempted to call for [facility] Telehealth visit; no answer, left voicemail message via interpreter M. [place]. Swayze Pries, RN   

## 2019-08-18 ENCOUNTER — Encounter: Payer: Self-pay | Admitting: Advanced Practice Midwife

## 2019-08-18 ENCOUNTER — Other Ambulatory Visit: Payer: Self-pay

## 2019-08-18 ENCOUNTER — Ambulatory Visit: Payer: Self-pay | Admitting: Advanced Practice Midwife

## 2019-08-18 VITALS — BP 114/69 | Temp 98.2°F | Wt 155.0 lb

## 2019-08-18 DIAGNOSIS — O2342 Unspecified infection of urinary tract in pregnancy, second trimester: Secondary | ICD-10-CM

## 2019-08-18 DIAGNOSIS — O0990 Supervision of high risk pregnancy, unspecified, unspecified trimester: Secondary | ICD-10-CM

## 2019-08-18 DIAGNOSIS — O4442 Low lying placenta NOS or without hemorrhage, second trimester: Secondary | ICD-10-CM

## 2019-08-18 LAB — WET PREP FOR TRICH, YEAST, CLUE
Clue Cell Exam: POSITIVE — AB
Trichomonas Exam: NEGATIVE

## 2019-08-18 LAB — HIV ANTIBODY (ROUTINE TESTING W REFLEX): HIV 1&2 Ab, 4th Generation: NONREACTIVE

## 2019-08-18 MED ORDER — CLOTRIMAZOLE 1 % VA CREA: 1.0000 | TOPICAL_CREAM | Freq: Every day | VAGINAL | 0 refills | Status: AC

## 2019-08-18 MED ORDER — TETANUS-DIPHTH-ACELL PERTUSSIS 5-2.5-18.5 LF-MCG/0.5 IM SUSP: 0.5000 mL | Freq: Once | INTRAMUSCULAR | 0 refills | Status: DC

## 2019-08-18 NOTE — Progress Notes (Signed)
In for visit; 28 wk. Labs today; agrees to Tdap vaccine; denies hospital visit since last appt.; c/o vaginal itching since yesterday. Considering O.C.s pp Debera Lat, RN

## 2019-08-18 NOTE — Addendum Note (Signed)
Addended by: Debera Lat on: 08/18/2019 11:16 AM   Modules accepted: Orders

## 2019-08-18 NOTE — Progress Notes (Signed)
   PRENATAL VISIT NOTE  Subjective:  Aldina Porta is a 38 y.o. [id number] at [redacted]w[redacted]d being seen today for ongoing prenatal care.  She is currently monitored for the following issues for this high-risk pregnancy and has Advanced maternal age in multigravida, first trimester; Low lying placenta nos or without hemorrhage, second trimester; Supervision of high risk pregnancy, antepartum; UTI in pregnancy, second trimester; History of abnormal cervical Pap smear; and Overweight (BMI 25.0-29.9) on their problem list.  Patient reports vaginal irritation, itching internally and externally since yesterday.  Contractions: Not present. Vag. Bleeding: None.  Movement: Present. Denies leaking of fluid/ROM.   The following portions of the patient's history were reviewed and updated as appropriate: allergies, current medications, past family history, past medical history, past social history, past surgical history and problem list. Problem list updated.  Objective:   Vitals:   08/18/19 0905  BP: 114/69  Temp: 98.2 F (36.8 C)  Weight: 155 lb (70.3 kg)    Fetal Status: Fetal Heart Rate (bpm): 140 Fundal Height: 28 cm Movement: Present     General:  Alert, oriented and cooperative. Patient is in no acute distress.  Skin: Skin is warm and dry. No rash noted.   Cardiovascular: Normal heart rate noted  Respiratory: Normal respiratory effort, no problems with respiration noted  Abdomen: Soft, gravid, appropriate for gestational age.  Pain/Pressure: Absent     Pelvic: Cervical exam deferred        Extremities: Normal range of motion.  Edema: None  Mental Status: Normal mood and affect. Normal behavior. Normal judgment and thought content.   Assessment and Plan:  Pregnancy: [id number] at [redacted]w[redacted]d  1. Supervision of high risk pregnancy, antepartum Feels well.  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  Needs Tdap today- Glucose, 1 hour gestational - RPR - HIV [place] [facility] - Hemoglobin, venipuncture - WET PREP FOR  TRICH, YEAST, CLUE  2. UTI in pregnancy, second trimester at 15 wks and treated with Bactrim TOC  06/02/19=neg  3. Low lying placenta nos or without hemorrhage, second trimester dx'd on 06/07/19.  F/U u/s at [facility] 08/23/19   Preterm labor symptoms and general obstetric precautions including but not limited to vaginal bleeding, contractions, leaking of fluid and fetal movement were reviewed in detail with the patient. Please refer to After Visit Summary for other counseling recommendations.  Return in about 2 weeks (around 09/01/2019) for telehealth.  Future Appointments  Date Time Provider [place]  08/23/2019 11:00 AM [facility] [place] [facility]    Herbie Saxon, [place]

## 2019-08-18 NOTE — Addendum Note (Signed)
Addended by: Debera Lat on: 08/18/2019 11:06 AM   Modules accepted: Orders

## 2019-08-19 ENCOUNTER — Other Ambulatory Visit: Payer: Self-pay

## 2019-08-19 DIAGNOSIS — O4442 Low lying placenta NOS or without hemorrhage, second trimester: Secondary | ICD-10-CM

## 2019-08-19 LAB — HEMOGLOBIN, FINGERSTICK

## 2019-08-19 LAB — RPR: RPR Ser Ql: NONREACTIVE

## 2019-08-19 LAB — GLUCOSE, 1 HOUR GESTATIONAL: Gestational Diabetes Screen: 122 mg/dL (ref 65–139)

## 2019-08-20 LAB — SPECIMEN STATUS REPORT

## 2019-08-20 LAB — HEMOGLOBIN AND HEMATOCRIT, BLOOD
Hematocrit: 33.6 % — ABNORMAL LOW (ref 34.0–46.6)
Hemoglobin: 11.1 g/dL (ref 11.1–15.9)

## 2019-08-23 ENCOUNTER — Other Ambulatory Visit: Payer: Self-pay

## 2019-08-23 ENCOUNTER — Ambulatory Visit
Admission: RE | Admit: 2019-08-23 | Discharge: 2019-08-23 | Disposition: A | Discharge status: Home / Self Care | Payer: Self-pay | Source: Ambulatory Visit | Attending: Maternal & Fetal Medicine | Admitting: Maternal & Fetal Medicine

## 2019-08-23 DIAGNOSIS — O4442 Low lying placenta NOS or without hemorrhage, second trimester: Secondary | ICD-10-CM

## 2019-08-23 DIAGNOSIS — Z3A29 29 weeks gestation of pregnancy: Secondary | ICD-10-CM | POA: Insufficient documentation

## 2019-08-23 DIAGNOSIS — O4443 Low lying placenta NOS or without hemorrhage, third trimester: Secondary | ICD-10-CM | POA: Insufficient documentation

## 2019-09-02 ENCOUNTER — Ambulatory Visit: Payer: Self-pay

## 2019-09-06 ENCOUNTER — Other Ambulatory Visit: Payer: Self-pay

## 2019-09-06 ENCOUNTER — Ambulatory Visit: Payer: Self-pay | Admitting: Family Medicine

## 2019-09-06 VITALS — BP 105/63 | Temp 98.8°F | Wt 154.4 lb

## 2019-09-06 DIAGNOSIS — O4442 Low lying placenta NOS or without hemorrhage, second trimester: Secondary | ICD-10-CM

## 2019-09-06 DIAGNOSIS — O09521 Supervision of elderly multigravida, first trimester: Secondary | ICD-10-CM

## 2019-09-06 DIAGNOSIS — O0990 Supervision of high risk pregnancy, unspecified, unspecified trimester: Secondary | ICD-10-CM

## 2019-09-06 DIAGNOSIS — O2342 Unspecified infection of urinary tract in pregnancy, second trimester: Secondary | ICD-10-CM

## 2019-09-06 DIAGNOSIS — Z8742 Personal history of other diseases of the female genital tract: Secondary | ICD-10-CM

## 2019-09-06 MED ORDER — PRENATAL VITAMINS 28-0.8 MG PO TABS: 1.0000 | ORAL_TABLET | Freq: Every day | ORAL | 0 refills | Status: AC

## 2019-09-06 NOTE — Progress Notes (Signed)
In for visit; denies hospital visits; discussed flu vaccine-declines; taking PNV-more given today Debera Lat, RN

## 2019-09-06 NOTE — Progress Notes (Signed)
   PRENATAL VISIT NOTE  Subjective:  Kimberly Cherry is a 38 y.o. [id number] at [redacted]w[redacted]d being seen today for ongoing prenatal care.  She is currently monitored for the following issues for this low-risk pregnancy and has Advanced maternal age in multigravida, first trimester; Low lying placenta nos or without hemorrhage, second trimester; Supervision of high risk pregnancy, antepartum; UTI in pregnancy, second trimester; History of abnormal cervical Pap smear; and Overweight (BMI 25.0-29.9) on their problem list.  Patient reports no complaints.  Contractions: Not present. Vag. Bleeding: None.  Movement: Present. Denies leaking of fluid/ROM.   The following portions of the patient's history were reviewed and updated as appropriate: allergies, current medications, past family history, past medical history, past social history, past surgical history and problem list. Problem list updated.  Objective:   Vitals:   09/06/19 1603  BP: 105/63  Temp: 98.8 F (37.1 C)  Weight: 154 lb 6.4 oz (70 kg)    Fetal Status: Fetal Heart Rate (bpm): 140 Fundal Height: 32 cm Movement: Present     General:  Alert, oriented and cooperative. Patient is in no acute distress.  Skin: Skin is warm and dry. No rash noted.   Cardiovascular: Normal heart rate noted  Respiratory: Normal respiratory effort, no problems with respiration noted  Abdomen: Soft, gravid, appropriate for gestational age.  Pain/Pressure: Absent     Pelvic: Cervical exam deferred        Extremities: Normal range of motion.  Edema: None  Mental Status: Normal mood and affect. Normal behavior. Normal judgment and thought content.   Assessment and Plan:  Pregnancy: [id number] at [redacted]w[redacted]d  1. Supervision of high risk pregnancy, antepartum Up to date Declined flu vaccination No BP cuff at home, discussed purchasing home BP cuff from pharmacy. If she gets cuff able to do telehealth visit.  - Prenatal Vit-Fe Fumarate-FA (PRENATAL VITAMINS) 28-0.8 MG  TABS; Take 1 tablet by mouth daily.  Dispense: 100 tablet; Refill: 0  2. UTI in pregnancy, second trimester TOC neg  3. History of abnormal cervical Pap smear Pap pp  4. Low lying placenta nos or without hemorrhage, second trimester Resolved at 9/14/202 [place]  5. Advanced maternal age in multigravida, first trimester    Preterm labor symptoms and general obstetric precautions including but not limited to vaginal bleeding, contractions, leaking of fluid and fetal movement were reviewed in detail with the patient.  Please refer to After Visit Summary for other counseling recommendations.  Return in about 2 weeks (around 09/20/2019) for Routine prenatal care, in person (no BP cuff at home).  Future Appointments  Date Time Provider [place]  09/20/2019  3:40 PM [facility] PROVIDER [facility] None    Caren Macadam, MD

## 2019-09-20 ENCOUNTER — Ambulatory Visit: Payer: Self-pay

## 2019-09-22 ENCOUNTER — Ambulatory Visit: Payer: Self-pay | Admitting: Advanced Practice Midwife

## 2019-09-22 ENCOUNTER — Other Ambulatory Visit: Payer: Self-pay

## 2019-09-22 VITALS — BP 110/69 | Temp 97.3°F | Wt 154.2 lb

## 2019-09-22 DIAGNOSIS — O4442 Low lying placenta NOS or without hemorrhage, second trimester: Secondary | ICD-10-CM

## 2019-09-22 DIAGNOSIS — O0990 Supervision of high risk pregnancy, unspecified, unspecified trimester: Secondary | ICD-10-CM

## 2019-09-22 DIAGNOSIS — O09521 Supervision of elderly multigravida, first trimester: Secondary | ICD-10-CM

## 2019-09-22 DIAGNOSIS — Z8742 Personal history of other diseases of the female genital tract: Secondary | ICD-10-CM

## 2019-09-22 LAB — WET PREP FOR TRICH, YEAST, CLUE: Trichomonas Exam: NEGATIVE

## 2019-09-22 MED ORDER — CLOTRIMAZOLE 1 % VA CREA: 1.0000 | TOPICAL_CREAM | Freq: Every day | VAGINAL | 0 refills | Status: AC

## 2019-09-22 NOTE — Addendum Note (Signed)
Addended by: Debera Lat on: 09/22/2019 03:54 PM   Modules accepted: Orders

## 2019-09-22 NOTE — Progress Notes (Signed)
   PRENATAL VISIT NOTE  Subjective:  Caitlan Chauca is a 38 y.o. [id number] at [redacted]w[redacted]d being seen today for ongoing prenatal care.  She is currently monitored for the following issues for this high-risk pregnancy and has Advanced maternal age in multigravida, first trimester; Low lying placenta nos or without hemorrhage, second trimester; Supervision of high risk pregnancy, antepartum; UTI in pregnancy, second trimester; History of abnormal cervical Pap smear; and Overweight (BMI 25.0-29.9) on their problem list.  Patient reports urinary frequency with voiding small amts x 1 week without dysuria; external vaginal itching with white d/c x 1 week.  Contractions: Not present. Vag. Bleeding: None.  Movement: Present. Denies leaking of fluid/ROM.   The following portions of the patient's history were reviewed and updated as appropriate: allergies, current medications, past family history, past medical history, past social history, past surgical history and problem list. Problem list updated.  Objective:   Vitals:   09/22/19 1515  BP: 110/69  Temp: (!) 97.3 F (36.3 C)  Weight: 154 lb 3.2 oz (69.9 kg)    Fetal Status: Fetal Heart Rate (bpm): 130 Fundal Height: 34 cm Movement: Present     General:  Alert, oriented and cooperative. Patient is in no acute distress.  Skin: Skin is warm and dry. No rash noted.   Cardiovascular: Normal heart rate noted  Respiratory: Normal respiratory effort, no problems with respiration noted  Abdomen: Soft, gravid, appropriate for gestational age.  Pain/Pressure: Absent     Pelvic: Cervical exam deferred      vagina with moderate rectocele, erythema with white thick leukorrhea, ph<4.5  Extremities: Normal range of motion.  Edema: None  Mental Status: Normal mood and affect. Normal behavior. Normal judgment and thought content.   Assessment and Plan:  Pregnancy: [id number] at [redacted]w[redacted]d  1. Supervision of high risk pregnancy, antepartum R/O monilia--wet mount done.   Treat for yeast please R/O UTI--C&S done.  Counseled to d/c coke and coffee - WET PREP FOR TRICH, YEAST, CLUE - Urine Culture & Sensitivity  2. Advanced maternal age in multigravida, first trimester 38 yo  3. Low lying placenta nos or without hemorrhage, second trimester Resolved on 08/23/19 u/s  4. History of abnormal cervical Pap smear Needs pap pp   Preterm labor symptoms and general obstetric precautions including but not limited to vaginal bleeding, contractions, leaking of fluid and fetal movement were reviewed in detail with the patient. Please refer to After Visit Summary for other counseling recommendations.  Return in about 2 weeks (around 10/06/2019) for routine PNC.  No future appointments.  Herbie Saxon, CNM

## 2019-09-22 NOTE — Addendum Note (Signed)
Addended by: Debera Lat on: 09/22/2019 03:55 PM   Modules accepted: Orders

## 2019-09-22 NOTE — Progress Notes (Signed)
In for visit; denies hospital visits; taking PNV; agrees to flu vaccine today; c/o vaginal itching with yellowish discharge Debera Lat, RN

## 2019-09-24 LAB — URINE CULTURE

## 2019-10-06 ENCOUNTER — Other Ambulatory Visit: Payer: Self-pay

## 2019-10-06 ENCOUNTER — Ambulatory Visit: Payer: Self-pay | Admitting: Advanced Practice Midwife

## 2019-10-06 VITALS — BP 115/76 | Temp 97.7°F | Wt 158.4 lb

## 2019-10-06 DIAGNOSIS — O0990 Supervision of high risk pregnancy, unspecified, unspecified trimester: Secondary | ICD-10-CM

## 2019-10-06 DIAGNOSIS — E663 Overweight: Secondary | ICD-10-CM

## 2019-10-06 NOTE — Progress Notes (Signed)
   PRENATAL VISIT NOTE  Subjective:  Kimberly Cherry is a 38 y.o. [id number] at [redacted]w[redacted]d being seen today for ongoing prenatal care.  She is currently monitored for the following issues for this high-risk pregnancy and has Advanced maternal age in multigravida, first trimester 38 yo; Low lying placenta nos or without hemorrhage, second trimester; Supervision of high risk pregnancy, antepartum; UTI in pregnancy, second trimester; History of abnormal cervical Pap smear; and Overweight (BMI 25.0-29.9) on their problem list.  Patient reports backache on 10/03/19.  Contractions: Not present. Vag. Bleeding: None.  Movement: Present. Denies leaking of fluid/ROM.   The following portions of the patient's history were reviewed and updated as appropriate: allergies, current medications, past family history, past medical history, past social history, past surgical history and problem list. Problem list updated.  Objective:   Vitals:   10/06/19 1606  BP: 115/76  Temp: 97.7 F (36.5 C)  Weight: 158 lb 6.4 oz (71.8 kg)    Fetal Status: Fetal Heart Rate (bpm): 130 Fundal Height: 35 cm Movement: Present  Presentation: Vertex  General:  Alert, oriented and cooperative. Patient is in no acute distress.  Skin: Skin is warm and dry. No rash noted.   Cardiovascular: Normal heart rate noted  Respiratory: Normal respiratory effort, no problems with respiration noted  Abdomen: Soft, gravid, appropriate for gestational age.  Pain/Pressure: Absent     Pelvic: Cervical exam deferred        Extremities: Normal range of motion.  Edema: None  Mental Status: Normal mood and affect. Normal behavior. Normal judgment and thought content.   Assessment and Plan:  Pregnancy: [id number] at [redacted]w[redacted]d  1. Supervision of high risk pregnancy, antepartum C/o backache with uterine "tightening" x1 hour on 10/03/19 with spontaneous resolution Knows when to go to L&D Ready for baby at home. Wants IUD pp for birth control  2.  Overweight (BMI 25.0-29.9) 13 lb 6.4 oz (6.078 kg)    Preterm labor symptoms and general obstetric precautions including but not limited to vaginal bleeding, contractions, leaking of fluid and fetal movement were reviewed in detail with the patient. Please refer to After Visit Summary for other counseling recommendations.  Return in about 1 week (around 10/13/2019) for routine PNC.  No future appointments.  Herbie Saxon, CNM

## 2019-10-06 NOTE — Progress Notes (Signed)
Here today for 35.5 week MH RV. Taking PNV QD. Denies ED/hospital visits since last RV. Hal Morales, RN

## 2019-10-13 ENCOUNTER — Ambulatory Visit: Payer: Self-pay

## 2019-10-13 ENCOUNTER — Ambulatory Visit: Payer: Self-pay | Admitting: Advanced Practice Midwife

## 2019-10-13 ENCOUNTER — Other Ambulatory Visit: Payer: Self-pay

## 2019-10-13 VITALS — BP 123/81 | Temp 97.7°F | Wt 155.8 lb

## 2019-10-13 DIAGNOSIS — Z8742 Personal history of other diseases of the female genital tract: Secondary | ICD-10-CM

## 2019-10-13 DIAGNOSIS — O0990 Supervision of high risk pregnancy, unspecified, unspecified trimester: Secondary | ICD-10-CM

## 2019-10-13 DIAGNOSIS — O09521 Supervision of elderly multigravida, first trimester: Secondary | ICD-10-CM

## 2019-10-13 LAB — OB RESULTS CONSOLE GC/CHLAMYDIA
Chlamydia: NEGATIVE
Gonorrhea: NEGATIVE

## 2019-10-13 LAB — OB RESULTS CONSOLE GBS: GBS: NEGATIVE

## 2019-10-13 NOTE — Progress Notes (Signed)
   PRENATAL VISIT NOTE  Subjective:  Kimberly Cherry is a 38 y.o. [id number] at [redacted]w[redacted]d being seen today for ongoing prenatal care.  She is currently monitored for the following issues for this high-risk pregnancy and has Advanced maternal age in multigravida, first trimester 38 yo; Low lying placenta nos or without hemorrhage, second trimester; Supervision of high risk pregnancy, antepartum; UTI in pregnancy, second trimester; History of abnormal cervical Pap smear; and Overweight (BMI 25.0-29.9) on their problem list.  Patient reports no complaints.  Contractions: Not present. Vag. Bleeding: None.  Movement: Present. Denies leaking of fluid/ROM.   The following portions of the patient's history were reviewed and updated as appropriate: allergies, current medications, past family history, past medical history, past social history, past surgical history and problem list. Problem list updated.  Objective:   Vitals:   10/13/19 1008  BP: 123/81  Temp: 97.7 F (36.5 C)  Weight: 155 lb 12.8 oz (70.7 kg)    Fetal Status: Fetal Heart Rate (bpm): 140 Fundal Height: 36 cm Movement: Present  Presentation: Vertex  General:  Alert, oriented and cooperative. Patient is in no acute distress.  Skin: Skin is warm and dry. No rash noted.   Cardiovascular: Normal heart rate noted  Respiratory: Normal respiratory effort, no problems with respiration noted  Abdomen: Soft, gravid, appropriate for gestational age.  Pain/Pressure: Absent     Pelvic: Cervical exam performed Dilation: 1 Effacement (%): Thick Station: Ballotable  Extremities: Normal range of motion.  Edema: None  Mental Status: Normal mood and affect. Normal behavior. Normal judgment and thought content.   Assessment and Plan:  Pregnancy: [id number] at [redacted]w[redacted]d  1. Supervision of high risk pregnancy, antepartum Knows when to go to L&D.  Last day of work 10/09/19.  Has car seat and ready for baby at home GC/Chlamydia/GBS cultures obtained -  Chlamydia/GC NAA, Confirmation - Culture, beta strep (group b only)  2. Advanced maternal age in multigravida, first trimester 38 yo 36 yo  3. History of abnormal cervical Pap smear Needs pap pp   Preterm labor symptoms and general obstetric precautions including but not limited to vaginal bleeding, contractions, leaking of fluid and fetal movement were reviewed in detail with the patient. Please refer to After Visit Summary for other counseling recommendations.  Return in about 1 week (around 10/20/2019) for routine PNC.  No future appointments.  Herbie Saxon, CNM

## 2019-10-13 NOTE — Progress Notes (Signed)
Patient here for MH RV at 36 5/7. 36 week labs and packet today. Patient prefers provider to collect labs.Jenetta Downer, RN

## 2019-10-15 LAB — CHLAMYDIA/GC NAA, CONFIRMATION
Chlamydia trachomatis, NAA: NEGATIVE
Neisseria gonorrhoeae, NAA: NEGATIVE

## 2019-10-17 LAB — CULTURE, BETA STREP (GROUP B ONLY): Strep Gp B Culture: NEGATIVE

## 2019-10-22 ENCOUNTER — Ambulatory Visit: Payer: Self-pay | Admitting: Advanced Practice Midwife

## 2019-10-22 ENCOUNTER — Other Ambulatory Visit: Payer: Self-pay

## 2019-10-22 DIAGNOSIS — O0990 Supervision of high risk pregnancy, unspecified, unspecified trimester: Secondary | ICD-10-CM

## 2019-10-22 NOTE — Progress Notes (Signed)
In for visit; denies hospital visits; taking PNV Demarquis Osley, RN  

## 2019-10-22 NOTE — Progress Notes (Signed)
   PRENATAL VISIT NOTE  Subjective:  Kimberly Cherry is a 38 y.o. [id number] at [redacted]w[redacted]d being seen today for ongoing prenatal care.  She is currently monitored for the following issues for this high-risk pregnancy and has Advanced maternal age in multigravida, first trimester 38 yo; Low lying placenta nos or without hemorrhage, second trimester; Supervision of high risk pregnancy, antepartum; UTI in pregnancy, second trimester; History of abnormal cervical Pap smear; and Overweight (BMI 25.0-29.9) on their problem list.  Patient reports no complaints.  Contractions: Not present. Vag. Bleeding: None.  Movement: Present.  Denies leaking of fluid/ROM.   The following portions of the patient's history were reviewed and updated as appropriate: allergies, current medications, past family history, past medical history, past social history, past surgical history and problem list. Problem list updated.  Objective:   Vitals:   10/22/19 0956  BP: 125/71  Temp: 98.2 F (36.8 C)  Weight: 155 lb 9.6 oz (70.6 kg)    Fetal Status: Fetal Heart Rate (bpm): 130 Fundal Height: 37 cm Movement: Present     General:  Alert, oriented and cooperative. Patient is in no acute distress.  Skin: Skin is warm and dry. No rash noted.   Cardiovascular: Normal heart rate noted  Respiratory: Normal respiratory effort, no problems with respiration noted  Abdomen: Soft, gravid, appropriate for gestational age.  Pain/Pressure: Absent     Pelvic: Cervical exam deferred        Extremities: Normal range of motion.  Edema: None  Mental Status: Normal mood and affect. Normal behavior. Normal judgment and thought content.   Assessment and Plan:  Pregnancy: [id number] at [redacted]w[redacted]d  1. Supervision of high risk pregnancy, antepartum Awaiting labor.  Knows when to go to L&D.  Ready for baby at home   Term labor symptoms and general obstetric precautions including but not limited to vaginal bleeding, contractions, leaking of fluid  and fetal movement were reviewed in detail with the patient. Please refer to After Visit Summary for other counseling recommendations.  No follow-ups on file.  Future Appointments  Date Time Provider [place]  10/29/2019  9:00 AM [facility] PROVIDER [facility] None    Herbie Saxon, CNM

## 2019-10-29 ENCOUNTER — Other Ambulatory Visit: Payer: Self-pay

## 2019-10-29 ENCOUNTER — Ambulatory Visit: Payer: Self-pay | Admitting: Advanced Practice Midwife

## 2019-10-29 DIAGNOSIS — O0990 Supervision of high risk pregnancy, unspecified, unspecified trimester: Secondary | ICD-10-CM

## 2019-10-29 NOTE — Progress Notes (Signed)
Here today for 39.0 week MH RV. Taking PNV QD, denies ED/hospital visits since last RV. Needs cervical check today. Hal Morales, RN

## 2019-10-29 NOTE — Progress Notes (Signed)
   PRENATAL VISIT NOTE  Subjective:  Kimberly Cherry is a 38 y.o. [id number] at [redacted]w[redacted]d being seen today for ongoing prenatal care.  She is currently monitored for the following issues for this high-risk pregnancy and has Advanced maternal age in multigravida, first trimester 38 yo; Low lying placenta nos or without hemorrhage, second trimester; Supervision of high risk pregnancy, antepartum; UTI in pregnancy, second trimester; History of abnormal cervical Pap smear; and Overweight (BMI 25.0-29.9) on their problem list.  Patient reports no complaints.  Contractions: Not present. Vag. Bleeding: None.  Movement: Present. Denies leaking of fluid/ROM.   The following portions of the patient's history were reviewed and updated as appropriate: allergies, current medications, past family history, past medical history, past social history, past surgical history and problem list. Problem list updated.  Objective:   Vitals:   10/29/19 0928  BP: 122/86  Temp: (!) 97.4 F (36.3 C)  Weight: 157 lb 6.4 oz (71.4 kg)    Fetal Status: Fetal Heart Rate (bpm): 140 Fundal Height: 38 cm Movement: Present  Presentation: Vertex  General:  Alert, oriented and cooperative. Patient is in no acute distress.  Skin: Skin is warm and dry. No rash noted.   Cardiovascular: Normal heart rate noted  Respiratory: Normal respiratory effort, no problems with respiration noted  Abdomen: Soft, gravid, appropriate for gestational age.  Pain/Pressure: Absent     Pelvic: Cervical exam performed Dilation: 2 Effacement (%): 50 Station: -3  Extremities: Normal range of motion.     Mental Status: Normal mood and affect. Normal behavior. Normal judgment and thought content.   Assessment and Plan:  Pregnancy: [id number] at 101w0d  1. Supervision of high risk pregnancy, antepartum Knows when to go to L&D Ready for baby at home [place] 11/19/19--paperwork completed   Term labor symptoms and general obstetric precautions  including but not limited to vaginal bleeding, contractions, leaking of fluid and fetal movement were reviewed in detail with the patient. Please refer to After Visit Summary for other counseling recommendations.  Return in about 1 week (around 11/05/2019) for routine PNC.  No future appointments.  Herbie Saxon, CNM

## 2019-11-02 ENCOUNTER — Telehealth: Payer: Self-pay

## 2019-11-02 NOTE — Telephone Encounter (Signed)
10/29/2019 IOL referral faxed to [facility] and call made to today to ascertain status of referral. Requested IOL is 11/19/2019. Per Blenda Nicely, Rebecca (midwife) does not have referral and unsure if has been received. As another midwife on call when referral faxed, Blenda Nicely is calling L & D to determine if appt scheduled. Per Blenda Nicely, IOL is not scheduled and referral with requested records re-faxed with confirmation received. Rich Number, RN

## 2019-11-04 ENCOUNTER — Inpatient Hospital Stay
Admission: EM | Admit: 2019-11-04 | Discharge: 2019-11-05 | DRG: 806 | Disposition: A | Discharge status: Home / Self Care | Payer: Medicaid Other | Attending: Obstetrics & Gynecology | Admitting: Obstetrics & Gynecology

## 2019-11-04 ENCOUNTER — Other Ambulatory Visit: Payer: Self-pay

## 2019-11-04 DIAGNOSIS — O479 False labor, unspecified: Secondary | ICD-10-CM | POA: Diagnosis present

## 2019-11-04 DIAGNOSIS — Z20828 Contact with and (suspected) exposure to other viral communicable diseases: Secondary | ICD-10-CM | POA: Diagnosis present

## 2019-11-04 DIAGNOSIS — O09521 Supervision of elderly multigravida, first trimester: Secondary | ICD-10-CM

## 2019-11-04 DIAGNOSIS — Z3A39 39 weeks gestation of pregnancy: Secondary | ICD-10-CM | POA: Diagnosis not present

## 2019-11-04 DIAGNOSIS — D62 Acute posthemorrhagic anemia: Secondary | ICD-10-CM | POA: Diagnosis not present

## 2019-11-04 DIAGNOSIS — O9081 Anemia of the puerperium: Secondary | ICD-10-CM | POA: Diagnosis not present

## 2019-11-04 DIAGNOSIS — O26893 Other specified pregnancy related conditions, third trimester: Secondary | ICD-10-CM | POA: Diagnosis present

## 2019-11-04 LAB — CBC
HCT: 35.3 % — ABNORMAL LOW (ref 36.0–46.0)
HCT: 30.3 % — ABNORMAL LOW (ref 36.0–46.0)
Hemoglobin: 12.3 g/dL (ref 12.0–15.0)
Hemoglobin: 10.6 g/dL — ABNORMAL LOW (ref 12.0–15.0)
MCH: 30.2 pg (ref 26.0–34.0)
MCH: 30.5 pg (ref 26.0–34.0)
MCHC: 34.8 g/dL (ref 30.0–36.0)
MCHC: 35 g/dL (ref 30.0–36.0)
MCV: 86.7 fL (ref 80.0–100.0)
MCV: 87.1 fL (ref 80.0–100.0)
Platelets: 239 10*3/uL (ref 150–400)
Platelets: 226 10*3/uL (ref 150–400)
RBC: 4.07 MIL/uL (ref 3.87–5.11)
RBC: 3.48 MIL/uL — ABNORMAL LOW (ref 3.87–5.11)
RDW: 14.9 % (ref 11.5–15.5)
RDW: 14.5 % (ref 11.5–15.5)
WBC: 12.4 10*3/uL — ABNORMAL HIGH (ref 4.0–10.5)
WBC: 18.9 10*3/uL — ABNORMAL HIGH (ref 4.0–10.5)
nRBC: 0 % (ref 0.0–0.2)
nRBC: 0 % (ref 0.0–0.2)

## 2019-11-04 LAB — TYPE AND SCREEN
ABO/RH(D): A POS
Antibody Screen: NEGATIVE

## 2019-11-04 LAB — SARS CORONAVIRUS 2 BY RT PCR (HOSPITAL ORDER, PERFORMED IN ~~LOC~~ HOSPITAL LAB): SARS Coronavirus 2: NEGATIVE

## 2019-11-04 MED ORDER — OXYTOCIN BOLUS FROM INFUSION
500.0000 mL | Freq: Once | INTRAVENOUS | Status: AC
  Administered 2019-11-04: 14:00:00 500 mL via INTRAVENOUS

## 2019-11-04 MED ORDER — SIMETHICONE 80 MG PO CHEW: 80.0000 mg | CHEWABLE_TABLET | ORAL | Status: DC | PRN

## 2019-11-04 MED ORDER — OXYTOCIN 40 UNITS IN NORMAL SALINE INFUSION - SIMPLE MED
2.5000 [IU]/h | INTRAVENOUS | Status: DC
  Filled 2019-11-04: qty 1000

## 2019-11-04 MED ORDER — LACTATED RINGERS IV SOLN
INTRAVENOUS | Status: DC
  Administered 2019-11-04: 09:00:00 via INTRAVENOUS

## 2019-11-04 MED ORDER — METHYLERGONOVINE MALEATE 0.2 MG PO TABS
0.2000 mg | ORAL_TABLET | Freq: Four times a day (QID) | ORAL | Status: DC
  Administered 2019-11-04 – 2019-11-05 (×3): 0.2 mg via ORAL
  Filled 2019-11-04 (×4): qty 1

## 2019-11-04 MED ORDER — FENTANYL CITRATE (PF) 100 MCG/2ML IJ SOLN
50.0000 ug | INTRAMUSCULAR | Status: DC | PRN
  Administered 2019-11-04 (×3): 50 ug via INTRAVENOUS
  Filled 2019-11-04 (×3): qty 2

## 2019-11-04 MED ORDER — LACTATED RINGERS IV SOLN: 500.0000 mL | INTRAVENOUS | Status: DC | PRN

## 2019-11-04 MED ORDER — ONDANSETRON HCL 4 MG/2ML IJ SOLN: 4.0000 mg | Freq: Four times a day (QID) | INTRAMUSCULAR | Status: DC | PRN

## 2019-11-04 MED ORDER — DIBUCAINE (PERIANAL) 1 % EX OINT: 1.0000 "application " | TOPICAL_OINTMENT | CUTANEOUS | Status: DC | PRN

## 2019-11-04 MED ORDER — DIPHENHYDRAMINE HCL 25 MG PO CAPS: 25.0000 mg | ORAL_CAPSULE | Freq: Four times a day (QID) | ORAL | Status: DC | PRN

## 2019-11-04 MED ORDER — METHYLERGONOVINE MALEATE 0.2 MG/ML IJ SOLN
0.2000 mg | Freq: Once | INTRAMUSCULAR | Status: AC
  Administered 2019-11-04: 14:00:00 0.2 mg via INTRAMUSCULAR

## 2019-11-04 MED ORDER — OXYCODONE-ACETAMINOPHEN 5-325 MG PO TABS: 2.0000 | ORAL_TABLET | ORAL | Status: DC | PRN

## 2019-11-04 MED ORDER — ONDANSETRON HCL 4 MG PO TABS: 4.0000 mg | ORAL_TABLET | ORAL | Status: DC | PRN

## 2019-11-04 MED ORDER — MISOPROSTOL 200 MCG PO TABS
800.0000 ug | ORAL_TABLET | Freq: Once | ORAL | Status: AC
  Administered 2019-11-04: 14:00:00 800 ug via RECTAL

## 2019-11-04 MED ORDER — BENZOCAINE-MENTHOL 20-0.5 % EX AERO: 1.0000 "application " | INHALATION_SPRAY | CUTANEOUS | Status: DC | PRN

## 2019-11-04 MED ORDER — IBUPROFEN 600 MG PO TABS
600.0000 mg | ORAL_TABLET | Freq: Four times a day (QID) | ORAL | Status: DC
  Administered 2019-11-04 – 2019-11-05 (×4): 600 mg via ORAL
  Filled 2019-11-04 (×4): qty 1

## 2019-11-04 MED ORDER — ZOLPIDEM TARTRATE 5 MG PO TABS: 5.0000 mg | ORAL_TABLET | Freq: Every evening | ORAL | Status: DC | PRN

## 2019-11-04 MED ORDER — LIDOCAINE HCL (PF) 1 % IJ SOLN: 30.0000 mL | INTRAMUSCULAR | Status: DC | PRN

## 2019-11-04 MED ORDER — WITCH HAZEL-GLYCERIN EX PADS: 1.0000 "application " | MEDICATED_PAD | CUTANEOUS | Status: DC | PRN

## 2019-11-04 MED ORDER — METHYLERGONOVINE MALEATE 0.2 MG/ML IJ SOLN
INTRAMUSCULAR | Status: AC
  Administered 2019-11-04: 14:00:00 0.2 mg via INTRAMUSCULAR
  Filled 2019-11-04: qty 1

## 2019-11-04 MED ORDER — TETANUS-DIPHTH-ACELL PERTUSSIS 5-2.5-18.5 LF-MCG/0.5 IM SUSP: 0.5000 mL | Freq: Once | INTRAMUSCULAR | Status: DC

## 2019-11-04 MED ORDER — COCONUT OIL OIL: 1.0000 "application " | TOPICAL_OIL | Status: DC | PRN

## 2019-11-04 MED ORDER — ONDANSETRON HCL 4 MG/2ML IJ SOLN: 4.0000 mg | INTRAMUSCULAR | Status: DC | PRN

## 2019-11-04 MED ORDER — PRENATAL MULTIVITAMIN CH
1.0000 | ORAL_TABLET | Freq: Every day | ORAL | Status: DC
  Administered 2019-11-05: 11:00:00 1 via ORAL
  Filled 2019-11-04: qty 1

## 2019-11-04 MED ORDER — SENNOSIDES-DOCUSATE SODIUM 8.6-50 MG PO TABS
2.0000 | ORAL_TABLET | ORAL | Status: DC
  Administered 2019-11-04: 23:00:00 2 via ORAL
  Filled 2019-11-04: qty 2

## 2019-11-04 MED ORDER — ACETAMINOPHEN 325 MG PO TABS: 650.0000 mg | ORAL_TABLET | ORAL | Status: DC | PRN

## 2019-11-04 MED ORDER — SOD CITRATE-CITRIC ACID 500-334 MG/5ML PO SOLN: 30.0000 mL | ORAL | Status: DC | PRN

## 2019-11-04 MED ORDER — ACETAMINOPHEN 325 MG PO TABS
650.0000 mg | ORAL_TABLET | ORAL | Status: DC | PRN
  Administered 2019-11-04 (×2): 650 mg via ORAL
  Filled 2019-11-04 (×2): qty 2

## 2019-11-04 MED ORDER — OXYCODONE-ACETAMINOPHEN 5-325 MG PO TABS: 1.0000 | ORAL_TABLET | ORAL | Status: DC | PRN

## 2019-11-04 NOTE — Progress Notes (Signed)
Progress Note  Kimberly Cherry is a 38 y.o. [id number] at [redacted]w[redacted]d observed for r/o labor.    Subjective: Pt reports UCs that are becoming more intense particularly in her back. Last pregnancy was seven years ago, see went to the hospital and was 3cm and they AROMd her for augmentation.   Objective: BP 130/86 (BP Location: Left Arm)   Pulse 87   Temp 98.7 F (37.1 C) (Oral)   Resp 18   Ht 5\' 1"  (1.549 m)   Wt 71.2 kg   LMP 02/16/2019 (Exact Date)   BMI 29.66 kg/m   Fetal Assessment: FHT:  FHR: 140 bpm, variability: moderate,  accelerations:  absent,  decelerations:  Absent Category/reactivity:  Category I UC:   regular, every 2-3 minutes SVE:    Dilation: 3cm  Effacement: 40%  Station:  -3  Consistency: medium  Position: middle  Membrane status: Intact Amniotic color: n/a   Assessment / Plan: R/o labor  Continue to monitor for labor  Clydene Laming, CNM 11/04/2019, 5:41 AM

## 2019-11-04 NOTE — Discharge Summary (Signed)
Obstetrical Discharge Summary  Patient Name: Kimberly Cherry DOB: 1981/03/22 MRN: [id number]  Date of Admission: 11/04/2019 Date of Delivery: 11/04/19 Delivered by: Heloise Ochoa CNM Date of Discharge: 11/05/2019  Primary OB: [facility]  [id number] last menstrual period was 02/16/2019 (exact date). EDC Estimated Date of Delivery: 11/05/19 Gestational Age at Delivery: [redacted]w[redacted]d   Antepartum complications:  1. Advanced maternal age  Admitting Diagnosis: Labor Secondary Diagnosis:  SVD Tight nuchal cord 1st deg perineal lac PP hemorrhage  Patient Active Problem List   Diagnosis Date Noted  . Uterine contractions during pregnancy 11/04/2019  . Normal labor 11/04/2019  . Supervision of high risk pregnancy, antepartum 06/21/2019  . UTI in pregnancy, second trimester 06/21/2019  . History of abnormal cervical Pap smear 06/21/2019  . Overweight (BMI 25.0-29.9) 06/21/2019  . Low lying placenta nos or without hemorrhage, second trimester 06/07/2019  . Advanced maternal age in multigravida, first trimester 38 yo     Augmentation: AROM Complications: Hemorrhage>1014mL Intrapartum complications/course: labored spontaneously, AROM for augment. Boggy uterus, given cytotec and methergine for PP hemorrhage. Given 4 doses PO methergine for PP hemorrhage. Date of Delivery: 11/04/19 Delivered by: Heloise Ochoa CNM Delivery Type: spontaneous vaginal delivery Anesthesia: none, s/p IVPM x 2 doses Placenta: spontaneous Laceration: 1st deg perineal lac Episiotomy: none Newborn Data: Live born female "Moises" Birth Weight:   APGAR: 8, 9  Newborn Delivery   Birth date/time: 11/04/2019 13:42:00 Delivery type: Vaginal, Spontaneous      Postpartum Procedures: none  Post partum course:  Patient had an uncomplicated postpartum course, she was started on PO iron for acute anemia after PP hemorrhage. BPs were normal to mild range.  By time of discharge on PPD#1, her pain was controlled on  oral pain medications; she had appropriate lochia and was ambulating, voiding without difficulty and tolerating regular diet.  She was deemed stable for discharge to home.    Discharge Physical Exam:  BP 131/78 (BP Location: Left Arm)   Pulse 70   Temp 98.3 F (36.8 C) (Oral)   Resp 18   Ht 5\' 1"  (1.549 m)   Wt 71.2 kg   LMP 02/16/2019 (Exact Date)   SpO2 98%   Breastfeeding Unknown   BMI 29.66 kg/m   General: NAD CV: RRR Pulm: CTABL, nl effort ABD: s/nd/nt, fundus firm and below the umbilicus Lochia: small Perineum: well approximated/intact DVT Evaluation: LE non-ttp, no evidence of DVT on exam.  Hemoglobin  Date Value Ref Range Status  11/05/2019 10.1 (L) 12.0 - 15.0 g/dL Final  11/07/2019 83/15/1761 11.1 - 15.9 g/dL Final  60.7 37/09/6268  Final   HCT  Date Value Ref Range Status  11/05/2019 29.2 (L) 36.0 - 46.0 % Final   Hematocrit  Date Value Ref Range Status  08/18/2019 33.6 (L) 34.0 - 46.6 % Final     Disposition: stable, discharge to home. Baby Feeding: breastmilk and formula Baby Disposition: home with mom  Rh Immune globulin given: n/a Rubella vaccine given: immune Varicella vaccine given: immune Tdap vaccine given in AP or PP setting: tdap 08/18/19 Flu vaccine given in AP or PP setting: flu 09/22/19  Contraception: IUD  Prenatal Labs:  Blood type/Rh  A Pos  Antibody screen neg  Rubella Immune  Varicella Immune  RPR NR  HBsAg Neg  HIV NR  GC neg  Chlamydia neg  Genetic screening negative  1 hour GTT  122  GBS  neg      Plan:  Kimberly Cherry was discharged to home in  good condition. Follow-up appointment with delivering provider in 6 weeks.  Discharge Medications: Allergies as of 11/05/2019   No Known Allergies     Medication List    TAKE these medications   acetaminophen 325 MG tablet Commonly known as: Tylenol Take 2 tablets (650 mg total) by mouth every 4 (four) hours as needed (for pain scale < 4).   ibuprofen 600 MG  tablet Commonly known as: ADVIL Take 1 tablet (600 mg total) by mouth every 6 (six) hours as needed for mild pain or moderate pain.   Prenatal Vitamins 28-0.8 MG Tabs Take 1 tablet by mouth daily.   senna-docusate 8.6-50 MG tablet Commonly known as: Senokot-S Take 2 tablets by mouth daily. Start taking on: November 06, 2019       Follow-up Information    Emmory Solivan, Murray Hodgkins, CNM Follow up in 6 week(s).   Specialty: Obstetrics and Gynecology Why: routine postpartum appt Contact information: [place] [address] [phone]        Department, [facility] Follow up in 6 week(s).   Why: schedule appointment for IUD placement at 6-8wks postpartum Contact information: [place] [address] [phone]           Signed:  Francetta Found, CNM 11/05/2019  2:10 PM

## 2019-11-04 NOTE — Progress Notes (Signed)
Labor Progress Note  Kimberly Cherry is a 38 y.o. [id number] at [redacted]w[redacted]d by ultrasound admitted for active labor  Subjective: feeling more painful UCs, requests pain meds.   Objective: BP 133/88 (BP Location: Right Arm)   Pulse 95   Temp 98.8 F (37.1 C) (Oral)   Resp 18   Ht 5\' 1"  (1.549 m)   Wt 71.2 kg   LMP 02/16/2019 (Exact Date)   BMI 29.66 kg/m  Notable VS details: reviewed  Fetal Assessment: FHT:  FHR: 140 bpm, variability: moderate,  accelerations:  Present,  decelerations:  Absent Category/reactivity:  Category I UC:   regular, every 3-5 minutes SVE:   4/50/-1, scant bloody show noted.  Cervix midposition, soft.   AROM performed, scant clear fluid noted on glove only.  Membrane status: AROM at 0958 Amniotic color: clear  Labs: Lab Results  Component Value Date   WBC 12.4 (H) 11/04/2019   HGB 12.3 11/04/2019   HCT 35.3 (L) 11/04/2019   MCV 86.7 11/04/2019   PLT 239 11/04/2019    Assessment / Plan: Spontaneous labor, progressing normally  Labor: progressing normally, AROM performed now.  Preeclampsia: normotensive.  Fetal Wellbeing:  Category I Pain Control:  IV pain meds I/D:  n/a Anticipated MOD:  NSVD  Murray Hodgkins Marenda Accardi, CNM 11/04/2019, 10:00 AM

## 2019-11-04 NOTE — Progress Notes (Signed)
Labor Progress Note  Kimberly Cherry is a 38 y.o. [id number] at [redacted]w[redacted]d by ultrasound admitted for active labor  Subjective: feeling more painful UCs, requests pain meds.   Objective: BP 133/88 (BP Location: Right Arm)   Pulse 95   Temp 98.8 F (37.1 C) (Oral)   Resp 18   Ht 5\' 1"  (1.549 m)   Wt 71.2 kg   LMP 02/16/2019 (Exact Date)   BMI 29.66 kg/m  Notable VS details: reviewed  Fetal Assessment: FHT:  FHR: 135 bpm, variability: moderate,  accelerations:  Present,  decelerations:  Absent Category/reactivity:  Category I UC:   regular, every 2-5 minutes SVE:   6/90/-1, scant bloody show noted. No fluid return noted on exam.  Cervix anterior, soft.   Membrane status: AROM at 0958 Amniotic color: clear  Labs: Lab Results  Component Value Date   WBC 12.4 (H) 11/04/2019   HGB 12.3 11/04/2019   HCT 35.3 (L) 11/04/2019   MCV 86.7 11/04/2019   PLT 239 11/04/2019    Assessment / Plan: Spontaneous labor, progressing normally  Labor: progressing normally, AROM performed 2hrs ago Preeclampsia: normotensive.  Fetal Wellbeing:  Category I Pain Control:  IV pain meds I/D:  n/a Anticipated MOD:  NSVD  Francetta Found, CNM 11/04/2019, 12:49 PM

## 2019-11-04 NOTE — OB Triage Note (Signed)
Patient came in for observation for labor evaluation. Patient reports irregular uterine contractions and vaginal pressure that started last night at 2100. Patient denies leaking of fluid and denies vaginal bleeding and spotting. Vital signs stable and patient afebrile. Patient reports + FM. FHR baseline 130 with moderate variability with accelerations 15 x 15 and no decelerations. Significant other at bedside. Will continue to monitor.

## 2019-11-04 NOTE — Progress Notes (Signed)
Progress Note  Kimberly Cherry is a 38 y.o. Q4B2010 at [redacted]w[redacted]d observed for r/o labor.    Subjective: Pt reports UCs that are becoming more regular and a little more intense. When UC was noted on toco she does not report feeling the UC.  Pt reports worry about delivering at home, 14 years ago she delivered her last baby at home right after her water broke.    Objective: BP 130/86 (BP Location: Left Arm)   Pulse 87   Temp 98.7 F (37.1 C) (Oral)   Resp 18   Ht 5\' 1"  (1.549 m)   Wt 71.2 kg   LMP 02/16/2019 (Exact Date)   BMI 29.66 kg/m   Fetal Assessment: FHT:  FHR: 130 bpm, variability: moderate,  accelerations:  Present,  decelerations:  Absent Category/reactivity:  Category I UC:   regular, every 3-4 minutes SVE:    Dilation: 2cm  Effacement: 25%  Station:  -3  Consistency: medium  Position: middle  Membrane status: Intact Amniotic color: n/a   Assessment / Plan: R/o labor  Continue to monitor for labor  Clydene Laming, CNM 11/04/2019, 5:41 AM

## 2019-11-04 NOTE — H&P (Signed)
OB History & Physical   History of Present Illness:  Chief Complaint: contractions since 2100 last night  HPI:  Aili Casillas is a 38 y.o. [id number] female at [redacted]w[redacted]d dated by [place] at 15+3wks.  She presents to L&D for contractions.  Active FM, onset of ctx @ 2000 currently every 3-4 minutes, denies LOF, having some VB.    Pregnancy Issues: 1. Advanced maternal age    Maternal Medical History:   Past Medical History:  Diagnosis Date  . Vaginal Pap smear, abnormal     Past Surgical History:  Procedure Laterality Date  . CERVICAL BIOPSY      No Known Allergies  Prior to Admission medications   Medication Sig Start Date End Date Taking? Authorizing Provider  Prenatal Vit-Fe Fumarate-FA (PRENATAL VITAMINS) 28-0.8 MG TABS Take 1 tablet by mouth daily. 09/06/19 12/15/19 Yes Federico Flake, MD     Prenatal care site: [facility]   Social History: She  reports that she has never smoked. She has never used smokeless tobacco. She reports that she does not drink alcohol or use drugs.  Family History: family history includes Diabetes in her sister.   Review of Systems: A full review of systems was performed and negative except as noted in the HPI.     Physical Exam:  Vital Signs: BP 133/88 (BP Location: Right Arm)   Pulse 95   Temp 98.8 F (37.1 C) (Oral)   Resp 18   Ht 5\' 1"  (1.549 m)   Wt 71.2 kg   LMP 02/16/2019 (Exact Date)   BMI 29.66 kg/m  General: no acute distress.  HEENT: normocephalic, atraumatic Heart: regular rate & rhythm.  No murmurs/rubs/gallops Lungs: clear to auscultation bilaterally, normal respiratory effort Abdomen: soft, gravid, non-tender;  EFW: 7lbs Pelvic:   External: Normal external female genitalia  Cervix: Dilation: 4 / Effacement (%): 50 / Station: -2    Extremities: non-tender, symmetric, no edema bilaterally.  DTRs: 2+  Neurologic: Alert & oriented x 3.    No results found for this or any previous visit (from  the past 24 hour(s)).  Pertinent Results:  Prenatal Labs: Blood type/Rh  A Pos  Antibody screen neg  Rubella Immune  Varicella Immune  RPR NR  HBsAg Neg  HIV NR  GC neg  Chlamydia neg  Genetic screening negative  1 hour GTT  122  GBS  neg   FHT: 140bpm, modvariability, + accels, no decels TOCO: q3-83min SVE:  Dilation: 4 / Effacement (%): 50 / Station: -2, medium position, soft.    Cephalic by leopolds  No results found.  Assessment:  Nevelyn Mellott is a 38 y.o. 31 female at [redacted]w[redacted]d with early labor.   Plan:  1. Admit to Labor & Delivery; consents reviewed and obtained - COVID test on admission  2. Fetal Well being  - Fetal Tracing: Cat I tracing - Group B Streptococcus ppx indicated: negative - Presentation: cephalic confirmed by exam   3. Routine OB: - Prenatal labs reviewed, as above - Rh A Pos - CBC, T&S, RPR on admit - Clear fluids, IVF  4. Monitoring of Labor -  Contractions: external toco in place -  Pelvis proven to 8.5lbs -  Plan for induction with AROM if needed -  Plan for continuous fetal monitoring  -  Maternal pain control as desired; requesting IVPM - Anticipate vaginal delivery  5. Post Partum Planning: - Infant feeding: breast - Contraception: IUD  [redacted]w[redacted]d, CNM 11/04/19  8:45 AM

## 2019-11-05 LAB — CBC
HCT: 29.2 % — ABNORMAL LOW (ref 36.0–46.0)
Hemoglobin: 10.1 g/dL — ABNORMAL LOW (ref 12.0–15.0)
MCH: 30.2 pg (ref 26.0–34.0)
MCHC: 34.6 g/dL (ref 30.0–36.0)
MCV: 87.4 fL (ref 80.0–100.0)
Platelets: 205 10*3/uL (ref 150–400)
RBC: 3.34 MIL/uL — ABNORMAL LOW (ref 3.87–5.11)
RDW: 14.8 % (ref 11.5–15.5)
WBC: 13.4 10*3/uL — ABNORMAL HIGH (ref 4.0–10.5)
nRBC: 0 % (ref 0.0–0.2)

## 2019-11-05 LAB — RPR: RPR Ser Ql: NONREACTIVE

## 2019-11-05 MED ORDER — IBUPROFEN 600 MG PO TABS: 600.0000 mg | ORAL_TABLET | Freq: Four times a day (QID) | ORAL | 0 refills | Status: DC | PRN

## 2019-11-05 MED ORDER — SENNOSIDES-DOCUSATE SODIUM 8.6-50 MG PO TABS: 2.0000 | ORAL_TABLET | ORAL | 0 refills | Status: DC

## 2019-11-05 MED ORDER — ACETAMINOPHEN 325 MG PO TABS: 650.0000 mg | ORAL_TABLET | ORAL | 0 refills | Status: DC | PRN

## 2019-11-05 NOTE — Lactation Note (Signed)
This note was copied from a baby's chart. Lactation Consultation Note  Patient Name: Kimberly Cherry [id number]'[id number] Date: 11/05/2019 Reason for consult: Initial assessment;Term Spanish interpreter Estill Bamberg present  Maternal Data Formula Feeding for Exclusion: No Does the patient have breastfeeding experience prior to this delivery?: Yes Mom encouraged to keep baby skin to skin to be alert to feeding cues, not necessary to feed formula, informed that frequent feeds help promote milk production   Feeding Feeding Type: Breast Fed Baby latched easily to breast LATCH Score Latch: Grasps breast easily, tongue down, lips flanged, rhythmical sucking.  Audible Swallowing: A few with stimulation  Type of Nipple: Everted at rest and after stimulation  Comfort (Breast/Nipple): Soft / non-tender  Hold (Positioning): No assistance needed to correctly position infant at breast.  LATCH Score: 9  Interventions Interventions: Breast feeding basics reviewed;Skin to skin;Adjust position;Support pillows  Lactation Tools Discussed/Used [place] Program: Yes [place] phone no and name written on white board, encouraged to call for questions / concerns, probable d/c to home this afternoon  Consult Status Consult Status: PRN    Ferol Luz 11/05/2019, 1:41 PM

## 2019-11-05 NOTE — Progress Notes (Signed)
Pt discharged with infant.  Discharge instructions, prescriptions and follow up appointment given to and reviewed with pt via interpreter. Pt verbalized understanding. Escorted out by staff. 

## 2019-11-05 NOTE — Discharge Instructions (Signed)
Consejos sobre cmo lograr un buen agarre para Museum/gallery exhibitions officeramamantar Breastfeeding Tips for a Good Latch El agarre, o que el beb "se prenda", se refiere a cmo la boca del beb se agarra al pezn al QUALCOMMamamantar. Es una parte importante de la Market researcherlactancia. El beb puede tener problemas para prenderse a la mama por diversos motivos. Si el beb no se prende bien, a usted se Associate Professorle pueden agrietar o Tenet Healthcareirritar los pezones, o puede tener otros Polsonproblemas. Siga estas indicaciones en su casa: Cmo ubicar al beb  Busque un lugar cmodo para sentarse o acostarse. Es importante que tenga bien apoyados el cuello y la espalda.  Si est sentada, coloque una almohada o una manta enrollada debajo del beb. De este modo, lo acercar al nivel de su mama.  Asegrese de que el vientre (abdomen) del beb est contra el suyo.  Pruebe diferentes posiciones hasta encontrar una que funcione tanto para usted como para el beb. Cmo ayudar a que el beb tenga un buen agarre   Para comenzar, frtese la mama con suavidad. Con las yemas de los dedos, masajee la pared del pecho hacia el pezn en un movimiento circular. Esto estimula el flujo de la Kerseyleche. Si es necesario, contine Sports coachhacindolo mientras amamanta.  Posicione su mama. Sostenga la mama con el pulgar por arriba del pezn y los otros cuatro dedos por debajo de la mama. Mantenga los dedos lejos del pezn y de la boca del beb. Siga estos pasos para ayudar al beb a prenderse: 1. Frote suavemente los labios del beb con el pezn o con el dedo. 2. Cuando la boca del beb se abra lo suficiente, acrquelo rpidamente a la mama e introduzca todo el pezn dentro de la boca del beb. Introduzca en la boca del beb tanto de la zona de color que rodea el pezn (la arola)como sea posible. 3. La lengua del beb debe estar entre la enca inferior y la [place]mama. 4. Debe poder ver ms arola por arriba del labio superior del beb que por debajo del labio inferior. 5. Cuando el beb empiece a succionar,  sentir un jaln suave en el pezn. No debera sentir nada de dolor. Tenga paciencia. Es comn que un beb succione durante 2 o 3minutos antes de que comience el flujo de [place] materna. 6. Asegrese de que la boca del beb est posicionada correctamente alrededor del pezn. Los labios del beb deben crear un sello sobre la mama y estar orientados hacia fuera.  Instrucciones generales  Busque estos signos de que el beb se ha prendido bien al pezn: ? El beb tironea o succiona con tranquilidad, sin causarle dolor. ? Oye que el beb traga despus de succionar 3 o 4 veces. ? Ve movimiento por arriba y delante de las orejas del beb mientras succiona.  Preste atencin a estos signos de que el beb no se ha prendido bien al pezn: ? El beb hace sonidos al succionar o chasquidos mientras amamanta. ? Tiene dolor en los pezones.  Si el beb no est bien prendido, introduzca el Micron Technologymeique entre las encas del beb y el pezn. De este modo romper el sello. Luego, intente ayudar al beb a prenderse otra vez.  Si contina teniendo problemas, solicite ayuda a un especialista en lactancia (asesor en Market researcherlactancia). Comunquese con un mdico si:  Presenta agrietamiento o irritacin en los pezones durante ms de 1semana.  Tiene dolor en los pezones.  Tiene demasiada State Farmleche en las mamas (congestin Odessamamaria) y no mejora despus de un lapso  de 48 a 72 horas.  Tiene una obstruccin en un conducto mamario y [place].  Sigue los consejos para que el beb se prenda bien, pero contina teniendo problemas o preocupaciones.  Observa que sale un lquido similar al pus por la mama.  Su beb no aumenta de peso.  Su beb pierde peso. Resumen  El agarre, o que el beb "se prenda", se refiere a cmo la boca del beb se agarra al pezn al [employer]ALCOMM.  Pruebe posiciones diferentes para Copy una que funcione tanto para usted como para el beb.  Si el beb no se prende bien, a usted se Environmental consultant o Tenet Healthcare, o puede tener otros [place]. Esta informacin no tiene Theme park manager el consejo del mdico. Asegrese de hacerle al mdico cualquier pregunta que tenga. Document Released: 08/21/2017 Document Revised: 08/21/2017 Document Reviewed: 08/21/2017 Elsevier Patient Education  2020 [employer]vin[employer]. Parto vaginal, cuidados de puerperio Postpartum Care After Vaginal Delivery Lea esta informacin sobre cmo cuidarse desde el momento en que nazca su beb y Elaina Hoops 6 a 12 semanas despus del parto (perodo del posparto). El mdico tambin podr darle instrucciones ms especficas. Comunquese con su mdico si tiene problemas o preguntas. Siga estas indicaciones en su casa: Hemorragia vaginal  Es normal tener un poco de hemorragia vaginal (loquios) despus del parto. Use un apsito sanitario para el sangrado vaginal y secrecin. ? Durante la primera semana despus del parto, la cantidad y el aspecto de los loquios a menudo es similar a las del perodo menstrual. ? Durante las siguientes semanas disminuir gradualmente hasta convertirse en una secrecin seca amarronada o [place]. ? En la [employer], los loquios se detienen Guardian Life Insurance 4 a 6semanas despus del [place]. Los sangrados vaginales pueden variar de mujer a Nurse, learning disability.  Cambie los apsitos sanitarios con frecuencia. Observe si hay cambios en el flujo, como: ? Un aumento repentino en el volumen. ? Cambio en el color. ? Cogulos sanguneos grandes.  Si expulsa un cogulo de sangre por la vagina, gurdelo y llame al mdico para informrselo. No deseche los cogulos de sangre por el inodoro antes de hablar con su mdico.  No use tampones ni se haga duchas vaginales hasta que el mdico la autorice.  Si no est amamantando, volver a tener su perodo entre 6 y 8 semanas despus del parto. Si solamente alimenta al beb con Alexis Goodell materna (lactancia materna exclusiva), podra no volver a tener su perodo  hasta que deje de [place]. Cuidados perineales  Mantenga la zona entre la vagina y el ano (perineo) limpia y [place], [place] se lo haya indicado el mdico. Utilice apsitos o aerosoles analgsicos y Control and instrumentation engineer, como se lo hayan indicado.  Si le hicieron un corte en el perineo (episiotoma) o tuvo un desgarro en la vagina, controle la zona para detectar signos de infeccin hasta que sane. Est atenta a los siguientes signos: ? Aumento del enrojecimiento, la hinchazn o Chief Technology Officer. ? Presenta lquido o sangre que supura del corte o Insurance account manager. ? Calor. ? Pus o mal olor.  Es posible que le den una botella rociadora para que use en lugar de limpiarse el rea con papel higinico despus de usar el bao. Cuando comience a Barrister's clerk, podr usar la botella rociadora antes de secarse. Asegrese de secarse suavemente.  Para aliviar el dolor causado por una episiotoma, un desgarro en la vagina o venas hinchadas en el ano (hemorroides), trate de tomar un bao de asiento tibio 2 o  3 veces por da. Un bao de asiento es un bao de agua tibia que se toma mientras se est sentado. El agua solo debe Systems analyst las caderas y cubrir las nalgas. [place] despus del parto, las mamas pueden sentirse pesadas, llenas e incmodas (congestin [place]). Tambin puede escaparse leche de sus senos. El mdico puede sugerirle mtodos para Gaffer. La congestin mamaria debera desaparecer al cabo de [employer]cel Energy.  Si est amamantando: ? Use un sostn que sujete y ajuste bien sus pechos. ? Mantenga los pezones secos y limpios. Aplquese cremas y ungentos, como se lo haya indicado el mdico. ? Es posible que deba usar discos de algodn en el sostn para Tax adviser la [place] que se filtre de sus senos. ? Puede tener contracciones uterinas cada vez que amamante durante varias semanas despus del [employer]. Las contracciones uterinas ayudan al tero a Stage manager a su tamao habitual. ? Si tiene algn  problema con la lactancia materna, colabore con el mdico o un Lobbyist.  Si no est amamantando: ? Evite tocarse KeyCorp. Al hacerlo, podran producir ms leche. ? Use un sostn que le proporcione el ajuste correcto y compresas fras para reducir la hinchazn. ? No extraiga (saque) SLM Corporation. Esto har que produzca ms [employer] Utilities. Intimidad y sexualidad  Pregntele al mdico cundo puede retomar la actividad sexual. Esto puede depender de lo siguiente: ? Su riesgo de sufrir infecciones. ? La rapidez con la que est sanando. ? Su comodidad y deseo de retomar la actividad sexual.  Despus del parto, puede quedar embarazada incluso si no ha tenido todava su perodo. Si lo desea, hable con el mdico acerca de los mtodos de control de la natalidad (mtodos anticonceptivos). Medicamentos  Delphi de venta libre y los recetados solamente como se lo haya indicado el mdico.  Si le recetaron un antibitico, tmelo como se lo haya indicado el mdico. No deje de tomar el antibitico aunque comience a sentirse mejor. Actividad  Retome sus actividades normales de a poco como se lo haya indicado el mdico. Pregntele al mdico qu actividades son seguras para usted.  Descanse todo lo que pueda. Trate de descansar o tomar una siesta mientras el beb duerme. Comida y bebida   Beba suficiente lquido como para Theatre manager la orina de color amarillo plido.  Coma alimentos ricos en [employer] Business Machines. Estos pueden ayudarla a prevenir o Cytogeneticist. Los alimentos ricos en fibras incluyen, entre otros: ? Panes y cereales integrales. ? Arroz integral. ? Optometrist. ? Lambert Mody y verduras frescas.  No intente perder de peso rpidamente reduciendo el consumo de caloras.  Tome sus vitaminas prenatales hasta la visita de seguimiento de posparto o hasta que su mdico le indique que puede dejar de tomarlas. Estilo de vida  No consuma ningn producto que  contenga nicotina o tabaco, como cigarrillos y Psychologist, sport and exercise. Si necesita ayuda para dejar de fumar, consulte al mdico.  No beba alcohol, especialmente si est amamantando. Instrucciones generales  Concurra a todas las visitas de seguimiento para usted y el beb, como se lo haya indicado el mdico. La mayora de las mujeres visita al mdico para un seguimiento de posparto dentro de las primeras 3 a 6 semanas despus del parto. Comunquese con un mdico si:  Se siente incapaz de controlar los cambios que implica tener un hijo y esos sentimientos no desaparecen.  Siente tristeza o preocupacin de forma [place]  mamas se ponen rojas, le duelen o se endurecen.  Tiene fiebre.  Tiene dificultad para retener la [place] o para impedir que la orina se escape.  Tiene poco inters o falta de inters en actividades que solan gustarle.  No ha amamantado nada y no ha tenido un perodo menstrual durante 12 semanas despus del [place].  Dej de amamantar al beb y no ha tenido su perodo menstrual durante 12 semanas despus de dejar de Museum/gallery exhibitions officer.  Tiene preguntas sobre su cuidado y el del beb.  Elimina un cogulo de sangre grande por la vagina. Solicite ayuda de inmediato si:  Midwife.  Tiene dificultad para respirar.  Tiene un dolor repentino e intenso en la pierna.  Tiene dolor intenso o clicos en el la parte inferior del abdomen.  Tiene una hemorragia tan intensa de la vagina que empapa ms de un apsito en [employer]. El sangrado no debe ser ms abundante que el perodo ms intenso que haya tenido.  Dolor de cabeza intenso.  Se desmaya.  Tiene visin borrosa o Nurse, adult.  Tiene secrecin vaginal con mal olor.  Tiene pensamientos acerca de lastimarse a usted misma o a su beb. Si alguna vez siente que puede lastimarse a usted misma o a Economist, o tiene pensamientos de poner fin a su vida, busque ayuda de inmediato. Puede dirigirse al  departamento de emergencias ms cercano o llamar a:  El servicio de emergencias de su localidad (911 en [place].).  Una lnea de asistencia al suicida y Visual merchandiser en crisis, como la [employer] de Prevencin del Suicidio (National Suicide Prevention Lifeline), al [phone]. Est disponible las 24 horas del da. Resumen  El perodo de tiempo justo despus el parto y Elaina Hoops 6 a 12 semanas despus del parto se denomina perodo posparto.  Retome sus actividades normales de a Naval architect se lo haya indicado el mdico.  Concurra a todas las visitas de seguimiento para usted y Dance movement psychotherapist beb, como se lo haya indicado el mdico. Esta informacin no tiene Theme park manager el consejo del mdico. Asegrese de hacerle al mdico cualquier pregunta que tenga. Document Released: 09/22/2007 Document Revised: 03/07/2018 Document Reviewed: 11/16/2017 Elsevier Patient Education  2020 ArvinMeritor.

## 2019-11-05 NOTE — Progress Notes (Signed)
Post Partum Day 1  Subjective: Doing well, no complaints.  Tolerating regular diet, pain with PO meds, voiding and ambulating without difficulty.  No CP SOB Fever,Chills, N/V or leg pain; denies nipple or breast pain, no HA change of vision, RUQ/epigastric pain  Objective: BP 131/78 (BP Location: Left Arm)   Pulse 70   Temp 98.3 F (36.8 C) (Oral)   Resp 18   Ht 5\' 1"  (1.549 m)   Wt 71.2 kg   LMP 02/16/2019 (Exact Date)   SpO2 98%   Breastfeeding Unknown   BMI 29.66 kg/m    Physical Exam:  General: NAD Breasts: soft/nontender CV: RRR Pulm: nl effort, CTABL Abdomen: soft, NT, BS x 4 Perineum: minimal edema, laceration repair well approximated Lochia: small Uterine Fundus: fundus firm and 1 fb below umbilicus DVT Evaluation: no cords, ttp LEs   Recent Labs    11/04/19 0757 11/04/19 1947  HGB 12.3 10.6*  HCT 35.3* 30.3*  WBC 12.4* 18.9*  PLT 239 226    Assessment/Plan: 38 y.o. [id number] postpartum day # 1  - Continue routine PP care - Lactation consult.  - Discussed contraceptive options including implant, IUDs hormonal and non-hormonal, injection, pills/ring/patch, condoms, and NFP.  - Acute blood loss anemia - hemodynamically stable and asymptomatic; start po ferrous sulfate BID with stool softeners; pending AM CBC.  - Immunization status: all Imms up to date    Disposition: Does desire Dc home today.    Francetta Found, CNM 11/05/2019  8:39 AM

## 2019-11-08 ENCOUNTER — Ambulatory Visit: Payer: Self-pay

## 2019-12-16 ENCOUNTER — Ambulatory Visit: Payer: Self-pay

## 2019-12-17 ENCOUNTER — Ambulatory Visit: Payer: Self-pay

## 2020-01-20 ENCOUNTER — Encounter: Payer: Self-pay | Admitting: Family Medicine

## 2020-01-20 ENCOUNTER — Other Ambulatory Visit: Payer: Self-pay

## 2020-01-20 ENCOUNTER — Ambulatory Visit: Payer: Self-pay | Admitting: Family Medicine

## 2020-01-20 DIAGNOSIS — Z8742 Personal history of other diseases of the female genital tract: Secondary | ICD-10-CM

## 2020-01-20 DIAGNOSIS — Z3009 Encounter for other general counseling and advice on contraception: Secondary | ICD-10-CM

## 2020-01-20 DIAGNOSIS — Z113 Encounter for screening for infections with a predominantly sexual mode of transmission: Secondary | ICD-10-CM

## 2020-01-20 DIAGNOSIS — Z8759 Personal history of other complications of pregnancy, childbirth and the puerperium: Secondary | ICD-10-CM

## 2020-01-20 LAB — HEMOGLOBIN, FINGERSTICK: Hemoglobin: 12.5 g/dL (ref 11.1–15.9)

## 2020-01-20 MED ORDER — NORETHINDRONE 0.35 MG PO TABS: 1.0000 | ORAL_TABLET | Freq: Every day | ORAL | 11 refills | Status: DC

## 2020-01-20 MED ORDER — MULTIVITAMINS PO CAPS: 1.0000 | ORAL_CAPSULE | Freq: Every day | ORAL | 11 refills | Status: DC

## 2020-01-20 NOTE — Progress Notes (Signed)
Patient here for PP exam. Needs Hgb and Pap today. Wants to use OC for BCM.Marland KitchenBurt Knack, RN

## 2020-01-20 NOTE — Progress Notes (Signed)
Patient hgb reviewed, no treatment needed. Patient given Sprintec 12 packs and aware she will be due for PE next year. Patient counseled to call for appointment when she starts last pack OC. Consent signed.Burt Knack, RN

## 2020-01-20 NOTE — Progress Notes (Signed)
Post Partum Exam  Kimberly Cherry is a 39 y.o. [id number] female who presents for a postpartum visit. She is 8 weeks postpartum following a spontaneous vaginal delivery. I have fully reviewed the prenatal and intrapartum course. The delivery was at 39.6 gestational weeks.  Anesthesia: local and IV sedation. Postpartum course has been uncomplicated. Baby's course has been uncomplicated. Baby is feeding by Breast Bleeding no bleeding. Bowel function is normal. Bladder function is normal. Patient is sexually active. Contraception method is oral progesterone-only contraceptive.   Postpartum depression screening: Edinburgh Postnatal Depression Scale - 01/20/20 1326      Edinburgh Postnatal Depression Scale:  In the Past 7 Days   I have been able to laugh and see the funny side of things.  0    I have looked forward with enjoyment to things.  0    I have blamed myself unnecessarily when things went wrong.  0    I have been anxious or worried for no good reason.  0    I have felt scared or panicky for no good reason.  3    Things have been getting on top of me.  0    I have been so unhappy that I have had difficulty sleeping.  0    I have felt sad or miserable.  0    I have been so unhappy that I have been crying.  0    The thought of harming myself has occurred to me.  0    Edinburgh Postnatal Depression Scale Total  3        The following portions of the patient's history were reviewed and updated as appropriate: allergies, current medications, past family history, past medical history, past social history, past surgical history and problem list. Last pap smear done 2017 and was Normal  Review of Systems Pertinent items are noted in HPI.    Objective:  BP 125/74   Wt 147 lb 12.8 oz (67 kg)   LMP 02/16/2019 (Exact Date)   Breastfeeding Yes   BMI 27.93 kg/m   Gen: well appearing, NAD HEENT: no scleral icterus CV: RR Lung: Normal WOB Breast:performed-no  Ext: warm well  perfused  GU: WNL, Pap collected Rectal: performed -  not indicated       Assessment:    Normal postpartum exam. Pap smear done at today's visit.   Plan:   1. Contraception: Contraception counseling: Reviewed all forms of birth control options in the tiered based approach. available including abstinence; over the counter/barrier methods; hormonal contraceptive medication including pill, patch, ring, injection,contraceptive implant; hormonal and nonhormonal IUDs; permanent sterilization options including vasectomy and the various tubal sterilization modalities. Risks, benefits, and typical effectiveness rates were reviewed.  Questions were answered.  Written information was also given to the patient to review.  Patient desires POPs- currently nursing, this was prescribed for patient. She will follow up in  7yr for surveillance.  She was told to call with any further questions, or with any concerns about this method of contraception.  Emphasized use of condoms 100% of the time for STI prevention.  Recommended delay in pregnancy for 18 months   2. Infant feeding:  patient is currently feeding with both formula and breastmilk (about 5 bottles per day 3.5oz).  Does not need letter for employer - is at home with children, not working out of home.   -Recommended patient engage with WIC/BFpeer counselors  -Counseled to sign new child up for [employer] services 3. Mood: EPDS  is low risk. Reviewed resources and that mood sx in first year after pregnancy are considered related to pregnancy and to reach out for help at [facility] if needed. Discussed [facility] as link to care and availability of LCSW for counseling  4. Chronic Medical Conditions:  PPH- hgb today  1. Family planning  2. Postpartum exam - desires STI screening today - Accepts MVI, RN to dispense - Desires [facility] for [facility], dispense 1 year supply - Hemoglobin, venipuncture  3. History of postpartum hemorrhage - Hemoglobin, venipuncture  Patient  given handout about PCP care in the community Given MVI per family planning program guidelines and availability  Follow up in: 1 year or as needed.

## 2020-01-24 LAB — IGP, APTIMA HPV
HPV Aptima: NEGATIVE
PAP Smear Comment: 0

## 2020-04-24 ENCOUNTER — Other Ambulatory Visit: Payer: Self-pay

## 2020-04-24 ENCOUNTER — Ambulatory Visit: Payer: Self-pay | Admitting: Physician Assistant

## 2020-04-24 ENCOUNTER — Encounter: Payer: Self-pay | Admitting: Physician Assistant

## 2020-04-24 DIAGNOSIS — Z113 Encounter for screening for infections with a predominantly sexual mode of transmission: Secondary | ICD-10-CM

## 2020-04-24 DIAGNOSIS — B373 Candidiasis of vulva and vagina: Secondary | ICD-10-CM

## 2020-04-24 DIAGNOSIS — B3731 Acute candidiasis of vulva and vagina: Secondary | ICD-10-CM

## 2020-04-24 LAB — WET PREP FOR TRICH, YEAST, CLUE: Trichomonas Exam: NEGATIVE

## 2020-04-24 MED ORDER — CLOTRIMAZOLE 1 % VA CREA: 1.0000 | TOPICAL_CREAM | Freq: Every day | VAGINAL | 0 refills | Status: AC

## 2020-04-24 MED ORDER — THERA VITAL M PO TABS: 1.0000 | ORAL_TABLET | Freq: Every day | ORAL | 0 refills | Status: DC

## 2020-04-24 NOTE — Progress Notes (Addendum)
Wet mount reviewed, patient treated for yeast per SO. MVI given.Burt Knack, RN

## 2020-04-24 NOTE — Progress Notes (Signed)
Patient here, with son,  for STD testing. Requesting more MVI.Marland KitchenBurt Knack, RN

## 2020-04-25 NOTE — Progress Notes (Signed)
[facility] Department STI clinic/screening visit  Subjective:  Kimberly Cherry is a 39 y.o. female being seen today for an STI screening visit. The patient reports they do have symptoms.  Patient reports that they do not desire a pregnancy in the next year.   They reported they are not interested in discussing contraception today.  No LMP recorded.   Patient has the following medical conditions:   Patient Active Problem List   Diagnosis Date Noted  . History of abnormal cervical Pap smear 06/21/2019  . Overweight (BMI 25.0-29.9) 06/21/2019    Chief Complaint  Patient presents with  . SEXUALLY TRANSMITTED DISEASE    screening    HPI  Patient reports that she is having vaginal itching that has been going on for 4 days.  Denies other symptoms.  States that she has not had a period since after delivery 10/2019, she is not breastfeeding but is taking OCs that are all the same color.  Reports recent change of soap/detergent.  See flowsheet for further details and programmatic requirements.    The following portions of the patient's history were reviewed and updated as appropriate: allergies, current medications, past medical history, past social history, past surgical history and problem list.  Objective:  There were no vitals filed for this visit.  Physical Exam Constitutional:      General: She is not in acute distress.    Appearance: Normal appearance.  HENT:     Head: Normocephalic and atraumatic.     Comments: No nits, lice or hair loss. No cervical, supraclavicular or axillary adenopathy.    Mouth/Throat:     Mouth: Mucous membranes are moist.     Pharynx: Oropharynx is clear. No oropharyngeal exudate or posterior oropharyngeal erythema.  Eyes:     Conjunctiva/sclera: Conjunctivae normal.  Pulmonary:     Effort: Pulmonary effort is normal.  Abdominal:     Palpations: Abdomen is soft. There is no mass.     Tenderness: There is no abdominal  tenderness. There is no guarding or rebound.  Genitourinary:    General: Normal vulva.     Rectum: Normal.     Comments: External genitalia/pubic area without nits, lice, edema, erythema, lesions and inguinal adenopathy. Vagina with normal mucosa and large amount of thick, white, clumping discharge. Cervix without visible lesions. Uterus firm, mobile, nt, no masses, no CMT, no adnexal tenderness. Musculoskeletal:     Cervical back: Neck supple. No tenderness.  Skin:    General: Skin is warm and dry.     Findings: No bruising, erythema, lesion or rash.  Neurological:     Mental Status: She is alert and oriented to person, place, and time.  Psychiatric:        Mood and Affect: Mood normal.        Behavior: Behavior normal.        Thought Content: Thought content normal.        Judgment: Judgment normal.      Assessment and Plan:  Kimberly Cherry is a 39 y.o. female presenting to the [facility] Department for STI screening  1. Screening for STD (sexually transmitted disease) Patient into clinic with symptoms. Rec condoms with all sex. Await test results.  Counseled that RN will call if needs to RTC for further treatment once results are back.  - Multiple Vitamins-Minerals (MULTIVITAMIN) tablet; Take 1 tablet by mouth daily.  Dispense: 100 tablet; Refill: 0 - WET PREP FOR TRICH, YEAST, CLUE - Chlamydia/Gonorrhea [place]  [facility] - [facility] [place] [facility] - Syphilis Serology, [place] [facility]  2. Vaginal candidiasis Will treat with Clotrimazole 1% vaginal cream 1 app qhs for 7 days. No sex for 7 days. - clotrimazole (GYNE-LOTRIMIN) 1 % vaginal cream; Place 1 Applicatorful vaginally at bedtime for 7 days.  Dispense: 45 g; Refill: 0     No follow-ups on file.  No future appointments.  Matt Holmes, PA

## 2021-01-22 ENCOUNTER — Ambulatory Visit (LOCAL_COMMUNITY_HEALTH_CENTER): Payer: Self-pay

## 2021-01-22 ENCOUNTER — Other Ambulatory Visit: Payer: Self-pay

## 2021-01-22 DIAGNOSIS — Z719 Counseling, unspecified: Secondary | ICD-10-CM

## 2021-01-22 NOTE — Progress Notes (Signed)
Here for more birth control pills. Co PE is due and needs provider visit.  Patient just opened up last pack of pills. Scheduled tomorrow for PE and refill. Richmond Campbell, RN

## 2021-01-23 ENCOUNTER — Ambulatory Visit: Payer: Self-pay

## 2021-02-02 ENCOUNTER — Other Ambulatory Visit: Payer: Self-pay

## 2021-02-02 ENCOUNTER — Ambulatory Visit: Payer: Self-pay

## 2021-02-02 ENCOUNTER — Ambulatory Visit (LOCAL_COMMUNITY_HEALTH_CENTER): Payer: Self-pay | Admitting: Advanced Practice Midwife

## 2021-02-02 ENCOUNTER — Encounter: Payer: Self-pay | Admitting: Advanced Practice Midwife

## 2021-02-02 VITALS — BP 114/81 | Wt 161.8 lb

## 2021-02-02 DIAGNOSIS — Z3009 Encounter for other general counseling and advice on contraception: Secondary | ICD-10-CM

## 2021-02-02 DIAGNOSIS — Z8742 Personal history of other diseases of the female genital tract: Secondary | ICD-10-CM

## 2021-02-02 DIAGNOSIS — Z3041 Encounter for surveillance of contraceptive pills: Secondary | ICD-10-CM

## 2021-02-02 MED ORDER — NORGESTIM-ETH ESTRAD TRIPHASIC 0.18/0.215/0.25 MG-25 MCG PO TABS: 1.0000 | ORAL_TABLET | Freq: Every day | ORAL | 13 refills | Status: DC

## 2021-02-02 NOTE — Progress Notes (Signed)
[facility] [address] Main Number: [phone]    Family Planning Visit- Initial Visit  Subjective:  Kimberly Cherry is a 40 y.o. SHF nonsmoker G5P5005 (16, 14, 11, 9, 1)  being seen today for an initial well woman visit and to discuss family planning options.  She is currently using Progesterone only pills for pregnancy prevention. Patient reports she does not want a pregnancy in the next year.  Patient has the following medical conditions has History of abnormal cervical Pap smear and Overweight (BMI 25.0-29.9) on their problem list.  Chief Complaint  Patient presents with  . Annual Exam  . Contraception    Patient reports wants more ocp's.  Not nursing 40 year old.  Last ocp today.  Last sex yesterday without condom; with current partner x 16 years, 1 partner in last 3 mo.  Last pap 01/20/20 neg HPV neg.  Unemployed and living with partner and 5 kids.  Patient denies cigs, vaping, cigars, MJ, ETOH  Body mass index is 30.57 kg/m. - Patient is eligible for diabetes screening based on BMI and age >68?  not applicable HA1C ordered? not applicable  Patient reports 1  partner/s in last year. Desires STI screening?  No - doesn't want STD screen  Has patient been screened once for HCV in the past?  No  No results found for: HCVAB  Does the patient have current drug use (including MJ), have a partner with drug use, and/or has been incarcerated since last result? No  If yes-- Screen for HCV through [facility]   Does the patient meet criteria for HBV testing? No  Criteria:  -Household, sexual or needle sharing contact with HBV -History of drug use -HIV positive -Those with known Hep C   Health Maintenance Due  Topic Date Due  . Hepatitis C Screening  Never done  . COVID-19 Vaccine (1) Never done  . INFLUENZA VACCINE  07/09/2020    Review of Systems  All other systems reviewed and are negative.   The  following portions of the patient's history were reviewed and updated as appropriate: allergies, current medications, past family history, past medical history, past social history, past surgical history and problem list. Problem list updated.   See flowsheet for other program required questions.  Objective:   Vitals:   02/02/21 1625  BP: 114/81  Weight: 161 lb 12.8 oz (73.4 kg)    Physical Exam Constitutional:      Appearance: Normal appearance. She is obese.  HENT:     Head: Normocephalic and atraumatic.     Mouth/Throat:     Mouth: Mucous membranes are moist.  Eyes:     Conjunctiva/sclera: Conjunctivae normal.  Neck:     Comments: Thyroid without masses or enlargement Negative cervical lymphadenopathy Cardiovascular:     Rate and Rhythm: Normal rate and regular rhythm.  Pulmonary:     Effort: Pulmonary effort is normal.     Breath sounds: Normal breath sounds.  Chest:  Breasts:     Right: Normal.     Left: Normal.    Abdominal:     Palpations: Abdomen is soft.     Comments: Poor tone, soft without masses or tenderness, increased adipose  Genitourinary:    General: Normal vulva.     Exam position: Lithotomy position.     Vagina: Vaginal discharge (white creamy leukorrhea, ph<4.5) present.     Cervix: Normal.     Uterus: Normal.  Adnexa: Right adnexa normal and left adnexa normal.     Rectum: Normal.  Musculoskeletal:        General: Normal range of motion.     Cervical back: Normal range of motion and neck supple.  Skin:    General: Skin is warm and dry.  Neurological:     Mental Status: She is alert.  Psychiatric:        Mood and Affect: Mood normal.       Assessment and Plan:  Kimberly Cherry is a 40 y.o. female presenting to the [facility] Department for an initial well woman exam/family planning visit  Contraception counseling: Reviewed all forms of birth control options in the tiered based approach. available including  abstinence; over the counter/barrier methods; hormonal contraceptive medication including pill, patch, ring, injection,contraceptive implant, ECP; hormonal and nonhormonal IUDs; permanent sterilization options including vasectomy and the various tubal sterilization modalities. Risks, benefits, and typical effectiveness rates were reviewed.  Questions were answered.  Written information was also given to the patient to review.  Patient desires ocp's, this was prescribed for patient. She will follow up in  1 year for surveillance.  She was told to call with any further questions, or with any concerns about this method of contraception.  Emphasized use of condoms 100% of the time for STI prevention.  Patient was not offered ECP. ECP was not accepted by the patient. ECP counseling was not given - see RN documentation  1. Family planning Pt without complaints. Nonsmoker, not breastfeeding, BP wnl.  Tri Lo Sprintec #13 I po daily  - [facility] [place] [facility] - Syphilis Serology, [place] [facility] - Norgestimate-Ethinyl Estradiol Triphasic (TRI-LO-SPRINTEC) 0.18/0.215/0.25 MG-25 MCG tab; Take 1 tablet by mouth daily.  Dispense: 28 tablet; Refill: 13  2. Encounter for surveillance of contraceptive pills See above note     Return in about 1 year (around 02/02/2022).  No future appointments.  Alberteen Spindle, CNM

## 2021-02-02 NOTE — Progress Notes (Signed)
Patient here for PE and more OC. Last Pap 2021, states she has 2 more weeks of OC left, no missed or late OC.Marland KitchenBurt Knack, RN

## 2021-02-02 NOTE — Progress Notes (Signed)
Patient signed new OC consent and given 9 packs of OC. Patient counseled to finish the pack she has at home and then start the new OC. Patient counseled that new OC is different than her current ones and to call with questions or concerns. Patient also counseled to call when she starts the 9th pack to make appointment to come for the other 4 prescribed packs. Patient states understanding.Burt Knack, RN

## 2021-05-22 ENCOUNTER — Other Ambulatory Visit: Payer: Self-pay

## 2021-05-22 ENCOUNTER — Encounter: Payer: Self-pay | Admitting: Advanced Practice Midwife

## 2021-05-22 ENCOUNTER — Ambulatory Visit: Payer: Self-pay | Admitting: Advanced Practice Midwife

## 2021-05-22 VITALS — BP 132/87 | Ht 60.0 in | Wt 162.5 lb

## 2021-05-22 DIAGNOSIS — Z3201 Encounter for pregnancy test, result positive: Secondary | ICD-10-CM

## 2021-05-22 DIAGNOSIS — B3731 Acute candidiasis of vulva and vagina: Secondary | ICD-10-CM

## 2021-05-22 DIAGNOSIS — Z113 Encounter for screening for infections with a predominantly sexual mode of transmission: Secondary | ICD-10-CM

## 2021-05-22 DIAGNOSIS — B373 Candidiasis of vulva and vagina: Secondary | ICD-10-CM

## 2021-05-22 LAB — PREGNANCY, URINE: Preg Test, Ur: POSITIVE — AB

## 2021-05-22 LAB — WET PREP FOR TRICH, YEAST, CLUE: Trichomonas Exam: NEGATIVE

## 2021-05-22 MED ORDER — CLOTRIMAZOLE 1 % VA CREA: 1.0000 | TOPICAL_CREAM | Freq: Every day | VAGINAL | 0 refills | Status: AC

## 2021-05-22 MED ORDER — PRENATAL VITAMIN 27-0.8 MG PO TABS: 1.0000 | ORAL_TABLET | Freq: Every day | ORAL | 0 refills | Status: DC

## 2021-05-22 NOTE — Progress Notes (Signed)
[facility] Department STI clinic/screening visit  Subjective:  Kimberly Cherry is a 40 y.o. MHF G5P5 nonsmoker female being seen today for an STI screening visit. The patient reports they do have symptoms.  Patient reports that they do not desire a pregnancy in the next year.   They reported they are not interested in discussing contraception today.  No LMP recorded.   Patient has the following medical conditions:   Patient Active Problem List   Diagnosis Date Noted   History of abnormal cervical Pap smear 06/21/2019   Overweight (BMI 25.0-29.9) 06/21/2019    Chief Complaint  Patient presents with   SEXUALLY TRANSMITTED DISEASE    STD screening including bloodwork    HPI  Patient reports yesterday after having sex, she is having external itching, and her husband's penis "looked like it was cut in half, with cuts and bleeding". LMP 04/15/21. Last sex 05/21/21 without condom; with current partner x 17 years. Has 6 packs of ocp's and taking them every day; last ocp yesterday and never forgets.   Last HIV test per patient/review of record was 02/02/21 Patient reports last pap was 01/20/20 neg HPV neg  See flowsheet for further details and programmatic requirements.    The following portions of the patient's history were reviewed and updated as appropriate: allergies, current medications, past medical history, past social history, past surgical history and problem list.  Objective:  There were no vitals filed for this visit.  Physical Exam Vitals and nursing note reviewed.  Constitutional:      Appearance: Normal appearance. She is obese.  HENT:     Head: Normocephalic and atraumatic.     Mouth/Throat:     Mouth: Mucous membranes are moist.     Pharynx: Oropharynx is clear. No oropharyngeal exudate or posterior oropharyngeal erythema.  Eyes:     Conjunctiva/sclera: Conjunctivae normal.  Pulmonary:     Effort: Pulmonary effort is normal.  Chest:  Breasts:     Right: No axillary adenopathy or supraclavicular adenopathy.     Left: No axillary adenopathy or supraclavicular adenopathy.  Abdominal:     General: Abdomen is flat.     Palpations: Abdomen is soft. There is no mass.     Tenderness: There is no abdominal tenderness. There is no rebound.     Comments: Soft without masses or tenderness  Genitourinary:    General: Normal vulva.     Exam position: Lithotomy position.     Pubic Area: No rash or pubic lice.      Labia:        Right: No rash or lesion.        Left: No rash or lesion.      Vagina: Normal. No vaginal discharge (white creamy leukorrhea, ph<4.5), erythema, bleeding or lesions.     Cervix: No cervical motion tenderness, discharge, friability, lesion or erythema.     Uterus: Normal.      Adnexa: Right adnexa normal and left adnexa normal.     Rectum: Normal.  Lymphadenopathy:     Head:     Right side of head: No preauricular or posterior auricular adenopathy.     Left side of head: No preauricular or posterior auricular adenopathy.     Cervical: No cervical adenopathy.     Upper Body:     Right upper body: No supraclavicular or axillary adenopathy.     Left upper body: No supraclavicular or axillary adenopathy.     Lower Body: No right inguinal  adenopathy. No left inguinal adenopathy.  Skin:    General: Skin is warm and dry.     Findings: No rash.  Neurological:     Mental Status: She is alert and oriented to person, place, and time.     Assessment and Plan:  [name] is a 39 y.o. female presenting to the [facility] Department for STI screening  1. Screening examination for venereal disease Encouraged pt to tell her husband to make apt for exam Treat wet mount per standing orders Immunization nurse consult - WET PREP FOR TRICH, YEAST, CLUE - Pregnancy, urine - Chlamydia/Gonorrhea [place] [facility] - HIV [facility] - Syphilis Serology, [place] [facility]     No follow-ups on file.  No future  appointments.  Alberteen Spindle, CNM

## 2021-05-22 NOTE — Progress Notes (Signed)
Pt states Pre-pregnancy Weight 165.0 lbs.  Pt desires pregnancy care at [facility]; sent to preadmit. PNV provided per standing order. Pt counseled to stop taking her OCP. Wet mount reviewed, pt treated for yeast per standing order.  Provider orders completed.

## 2021-07-11 NOTE — Progress Notes (Signed)
OB abstraction complete. Tawny Hopping, RN

## 2021-07-12 ENCOUNTER — Encounter: Payer: Self-pay | Admitting: Physician Assistant

## 2021-07-12 ENCOUNTER — Other Ambulatory Visit: Payer: Self-pay

## 2021-07-12 ENCOUNTER — Ambulatory Visit: Payer: Medicaid Other | Admitting: Physician Assistant

## 2021-07-12 ENCOUNTER — Ambulatory Visit: Payer: Self-pay

## 2021-07-12 VITALS — BP 125/78 | HR 71 | Temp 97.8°F | Wt 159.8 lb

## 2021-07-12 DIAGNOSIS — O09522 Supervision of elderly multigravida, second trimester: Secondary | ICD-10-CM

## 2021-07-12 DIAGNOSIS — O99212 Obesity complicating pregnancy, second trimester: Secondary | ICD-10-CM

## 2021-07-12 DIAGNOSIS — O99210 Obesity complicating pregnancy, unspecified trimester: Secondary | ICD-10-CM | POA: Insufficient documentation

## 2021-07-12 DIAGNOSIS — O0992 Supervision of high risk pregnancy, unspecified, second trimester: Secondary | ICD-10-CM

## 2021-07-12 DIAGNOSIS — O0990 Supervision of high risk pregnancy, unspecified, unspecified trimester: Secondary | ICD-10-CM

## 2021-07-12 DIAGNOSIS — O09521 Supervision of elderly multigravida, first trimester: Secondary | ICD-10-CM

## 2021-07-12 DIAGNOSIS — Z641 Problems related to multiparity: Secondary | ICD-10-CM | POA: Insufficient documentation

## 2021-07-12 DIAGNOSIS — Z3401 Encounter for supervision of normal first pregnancy, first trimester: Secondary | ICD-10-CM

## 2021-07-12 LAB — URINALYSIS
Bilirubin, UA: NEGATIVE
Glucose, UA: NEGATIVE
Ketones, UA: NEGATIVE
Leukocytes,UA: NEGATIVE
Nitrite, UA: NEGATIVE
Protein,UA: NEGATIVE
Specific Gravity, UA: 1.01 (ref 1.005–1.030)
Urobilinogen, Ur: 0.2 mg/dL (ref 0.2–1.0)
pH, UA: 5.5 (ref 5.0–7.5)

## 2021-07-12 LAB — HEMOGLOBIN, FINGERSTICK: Hemoglobin: 13.1 g/dL (ref 11.1–15.9)

## 2021-07-12 MED ORDER — ASPIRIN EC 81 MG PO TBEC: 81.0000 mg | DELAYED_RELEASE_TABLET | Freq: Every day | ORAL | Status: AC

## 2021-07-12 MED ORDER — PRENATAL VITAMINS 28-0.8 MG PO TABS: 1.0000 | ORAL_TABLET | Freq: Every day | ORAL | 0 refills | Status: DC

## 2021-07-12 NOTE — Progress Notes (Signed)
[facility] HEALTH DEPT [facility] [address] [place] [address] [phone]  INITIAL PRENATAL VISIT NOTE  Subjective:  Kimberly Cherry is a 40 y.o. G6P5005 at [redacted]w[redacted]d being seen today to start prenatal care at the [facility] Department.  She is currently monitored for the following issues for this high-risk pregnancy and has Advanced maternal age in multigravida, first trimester 40 yo; History of abnormal cervical Pap smear; Overweight (BMI 25.0-29.9); Supervision of high-risk pregnancy, unspecified trimester; Obesity affecting pregnancy, antepartum; and Grand multiparity on their problem list.  Patient reports nausea.  Contractions: Not present. Vag. Bleeding: None.  Movement: Absent. Denies leaking of fluid.   Indications for ASA therapy (per uptodate) One of the following: Previous pregnancy with preeclampsia, especially early onset and with an adverse outcome No Multifetal gestation No Chronic hypertension No Type 1 or 2 diabetes mellitus No Chronic kidney disease No Autoimmune disease (antiphospholipid syndrome, systemic lupus erythematosus) No  Two or more of the following: Nulliparity No Obesity (body mass index >30 kg/m2) Yes Family history of preeclampsia in mother or sister No Age ?35 years Yes Sociodemographic characteristics (African American race, low socioeconomic level) Yes Personal risk factors (eg, previous pregnancy with low birth weight or small for gestational age infant, previous adverse pregnancy outcome [eg, stillbirth], interval >10 years between pregnancies) No   The following portions of the patient's history were reviewed and updated as appropriate: allergies, current medications, past family history, past medical history, past social history, past surgical history and problem list. Problem list updated.  Objective:   Vitals:   07/12/21 0857  BP: 125/78  Pulse: 71  Temp: 97.8 F  (36.6 C)  Weight: 159 lb 12.8 oz (72.5 kg)    Fetal Status: Fetal Heart Rate (bpm): 136 Fundal Height: 15 cm Movement: Absent  Presentation: Undeterminable   Physical Exam Vitals and nursing note reviewed.  Constitutional:      General: She is not in acute distress.    Appearance: Normal appearance. She is well-developed. She is obese.  HENT:     Head: Normocephalic and atraumatic.     Right Ear: External ear normal.     Left Ear: External ear normal.     Nose: Nose normal. No congestion or rhinorrhea.     Mouth/Throat:     Lips: Pink.     Mouth: Mucous membranes are moist.     Dentition: Normal dentition. No dental caries.     Pharynx: Oropharynx is clear. Uvula midline.     Comments: Dentition: good repair Eyes:     General: No scleral icterus.    Conjunctiva/sclera: Conjunctivae normal.  Neck:     Thyroid: No thyroid mass or thyromegaly.  Cardiovascular:     Rate and Rhythm: Normal rate.     Pulses: Normal pulses.     Comments: Extremities are warm and well perfused Pulmonary:     Effort: Pulmonary effort is normal.     Breath sounds: Normal breath sounds.  Chest:     Chest wall: No mass.  Breasts:    Tanner Score is 5.     Breasts are symmetrical.     Right: Normal. No mass, nipple discharge, skin change or axillary adenopathy.     Left: Normal. No mass, nipple discharge, skin change or axillary adenopathy.  Abdominal:     General: Abdomen is flat.     Palpations: Abdomen is soft.     Tenderness: There is no abdominal tenderness.  Comments: Gravid   Genitourinary:    General: Normal vulva.     Exam position: Lithotomy position.     Pubic Area: No rash.      Labia:        Right: No rash.        Left: No rash.      Vagina: Normal. No vaginal discharge.     Cervix: No cervical motion tenderness or friability.     Uterus: Normal. Enlarged (Gravid 14-16 wk size). Not tender.      Adnexa: Right adnexa normal and left adnexa normal.     Rectum: Normal. No  external hemorrhoid.  Musculoskeletal:     Right lower leg: No edema.     Left lower leg: No edema.  Lymphadenopathy:     Cervical: No cervical adenopathy.     Upper Body:     Right upper body: No axillary adenopathy.     Left upper body: No axillary adenopathy.  Skin:    General: Skin is warm.     Capillary Refill: Capillary refill takes less than 2 seconds.  Neurological:     Mental Status: She is alert.    Assessment and Plan:  Pregnancy: G6P5005 at [redacted]w[redacted]d  1. Supervision of high-risk pregnancy, unspecified trimester Nausea, improving and mild and manageable with regular small snacks per pt. Unsure LMP with size > dates. Sched dating [place].  - Prenatal profile without Varicella/Rubella (458099) - HIV-1/HIV-2 Qualitative RNA - HCV Ab w Reflex to Quant PCR - Urine Culture - Chlamydia/GC NAA, Confirmation - QuantiFERON-TB Gold Plus - Urinalysis - Hemoglobin, fingerstick - Prenatal Vit-Fe Fumarate-FA (PRENATAL VITAMINS) 28-0.8 MG TABS; Take 1 tablet by mouth daily for 100 doses.  Dispense: 100 tablet; Refill: 0 - US OB Comp Less 14 Wks; Future  2. Obesity affecting pregnancy, antepartum Counseled re: appropriate wt gain. MTN referral. Agrees to start aspirin. - Glucose, 1 hour gestational - Hgb A1c w/o eAG - Comprehensive metabolic panel - Protein / creatinine ratio, urine  (Spot) - TSH - aspirin EC 81 MG tablet; Take 1 tablet (81 mg total) by mouth daily. Swallow whole.  3. Advanced maternal age in multigravida, first trimester 40 yo Counseled re: inc risk of fetal aneuploidy, rec genetic counseling, offered testing. Pt declined. Agrees to aspirin and dating [place] for now. - aspirin EC 81 MG tablet; Take 1 tablet (81 mg total) by mouth daily. Swallow whole.  4. Grand multiparity Enc compliance with prenatal care OBCM referral done.    Discussed overview of care and coordination with inpatient delivery practices including [facility], [facility], [facility] and [facility].    Due to language barrier, an interpreter (M. [place]) was present during the provider portion of the visit with this patient.    Preterm labor symptoms and general obstetric precautions including but not limited to vaginal bleeding, contractions, leaking of fluid and fetal movement were reviewed in detail with the patient.  Please refer to After Visit Summary for other counseling recommendations.   Return in about 4 weeks (around 08/09/2021) for Routine prenatal care.  Future Appointments  Date Time Provider Department Center  08/09/2021  9:00 AM [facility] PROVIDER [facility] None    Landry Dyke, PA-C

## 2021-07-12 NOTE — Progress Notes (Signed)
Hgb and UA results reviewed. Per standing orders no treatment indicated. Tawny Hopping, RN

## 2021-07-12 NOTE — Progress Notes (Signed)
Here today for 12.4 week MH IP. Taking PNV QD. Denies ED/hospital visits since +PT. 1hgtt today. More PNV's given. Tawny Hopping, RN

## 2021-07-13 ENCOUNTER — Telehealth: Payer: Self-pay | Admitting: Dietician

## 2021-07-13 LAB — PROTEIN / CREATININE RATIO, URINE
Creatinine, Urine: 30.9 mg/dL
Protein, Ur: 4.7 mg/dL
Protein/Creat Ratio: 152 mg/g creat (ref 0–200)

## 2021-07-14 LAB — CHLAMYDIA/GC NAA, CONFIRMATION
Chlamydia trachomatis, NAA: NEGATIVE
Neisseria gonorrhoeae, NAA: NEGATIVE

## 2021-07-14 LAB — HIV-1/HIV-2 QUALITATIVE RNA
HIV-1 RNA, Qualitative: NONREACTIVE
HIV-2 RNA, Qualitative: NONREACTIVE

## 2021-07-15 LAB — URINE CULTURE

## 2021-07-18 ENCOUNTER — Telehealth: Payer: Self-pay

## 2021-07-18 NOTE — Telephone Encounter (Signed)
Contacted patient because [facility] scheduling is having a difficult time contacting patient. Patient called [place] at [facility] and per Roxi at the front desk patient has attempted to return calls from [facility] [place] scheduling with no success.   Patient did not answer and LVM regarding her need to contact [facility] scheduling and gave her a direct number, Marylu Lund from [facility] [place] scheduling, her number is [phone].   Will attempt to call patient tomorrow to see if she has received this message.   Floy Sabina, RN

## 2021-07-18 NOTE — Addendum Note (Signed)
Addended by: Floy Sabina on: 07/18/2021 09:13 AM   Modules accepted: Orders

## 2021-07-19 NOTE — Telephone Encounter (Signed)
TC to patient to let her know that RN will schedule her U/S and then call her back. Patient states she is available any day of the week, am or pm. Per scheduling, next available appointment is 08/10/2021 at 1:00pm. Referral updated as patient will be greater than 14 weeks at the time of her appointment. Returned call to patient to inform of appointment day and counseled to arrive 12:45 with full bladder, and that she could bring 1 adult with her, but no children. Patient states understanding. Interpreter M. [place].Burt Knack, RN

## 2021-07-20 LAB — COMPREHENSIVE METABOLIC PANEL
ALT: 15 IU/L (ref 0–32)
AST: 13 IU/L (ref 0–40)
Albumin/Globulin Ratio: 1.6 (ref 1.2–2.2)
Albumin: 4.3 g/dL (ref 3.8–4.8)
Alkaline Phosphatase: 77 IU/L (ref 44–121)
BUN/Creatinine Ratio: 10 (ref 9–23)
BUN: 5 mg/dL — ABNORMAL LOW (ref 6–20)
Bilirubin Total: <0.2 mg/dL (ref 0.0–1.2)
CO2: 21 mmol/L (ref 20–29)
Calcium: 9.3 mg/dL (ref 8.7–10.2)
Chloride: 102 mmol/L (ref 96–106)
Creatinine, Ser: 0.51 mg/dL — ABNORMAL LOW (ref 0.57–1.00)
Globulin, Total: 2.7 g/dL (ref 1.5–4.5)
Glucose: 93 mg/dL (ref 65–99)
Potassium: 3.6 mmol/L (ref 3.5–5.2)
Sodium: 138 mmol/L (ref 134–144)
Total Protein: 7 g/dL (ref 6.0–8.5)
eGFR: 123 mL/min/{1.73_m2} (ref >59)

## 2021-07-20 LAB — CBC/D/PLT+RPR+RH+ABO+AB SCR
Antibody Screen: NEGATIVE
Basophils Absolute: 0 10*3/uL (ref 0.0–0.2)
Basos: 0 %
EOS (ABSOLUTE): 0.1 10*3/uL (ref 0.0–0.4)
Eos: 1 %
Hematocrit: 39.7 % (ref 34.0–46.6)
Hemoglobin: 13.2 g/dL (ref 11.1–15.9)
Hepatitis B Surface Ag: NEGATIVE
Immature Grans (Abs): 0 10*3/uL (ref 0.0–0.1)
Immature Granulocytes: 0 %
Lymphocytes Absolute: 3.4 10*3/uL — ABNORMAL HIGH (ref 0.7–3.1)
Lymphs: 33 %
MCH: 30.4 pg (ref 26.6–33.0)
MCHC: 33.2 g/dL (ref 31.5–35.7)
MCV: 92 fL (ref 79–97)
Monocytes Absolute: 0.5 10*3/uL (ref 0.1–0.9)
Monocytes: 5 %
Neutrophils Absolute: 6.2 10*3/uL (ref 1.4–7.0)
Neutrophils: 61 %
Platelets: 322 10*3/uL (ref 150–450)
RBC: 4.34 x10E6/uL (ref 3.77–5.28)
RDW: 13.8 % (ref 11.7–15.4)
RPR Ser Ql: NONREACTIVE
Rh Factor: POSITIVE
WBC: 10.2 10*3/uL (ref 3.4–10.8)

## 2021-07-20 LAB — QUANTIFERON-TB GOLD PLUS
QuantiFERON Mitogen Value: >10 IU/mL
QuantiFERON Nil Value: 0.19 IU/mL
QuantiFERON TB1 Ag Value: 0.37 IU/mL
QuantiFERON TB2 Ag Value: 0.23 IU/mL
QuantiFERON-TB Gold Plus: NEGATIVE

## 2021-07-20 LAB — TSH: TSH: 3.82 u[IU]/mL (ref 0.450–4.500)

## 2021-07-20 LAB — HCV INTERPRETATION

## 2021-07-20 LAB — HCV AB W REFLEX TO QUANT PCR: HCV Ab: <0.1 s/co ratio (ref 0.0–0.9)

## 2021-07-20 LAB — GLUCOSE, 1 HOUR GESTATIONAL: Gestational Diabetes Screen: 88 mg/dL (ref 65–139)

## 2021-07-20 LAB — HGB A1C W/O EAG: Hgb A1c MFr Bld: 5.4 % (ref 4.8–5.6)

## 2021-08-09 ENCOUNTER — Other Ambulatory Visit: Payer: Self-pay

## 2021-08-09 ENCOUNTER — Ambulatory Visit: Payer: Medicaid Other | Admitting: Physician Assistant

## 2021-08-09 ENCOUNTER — Encounter: Payer: Self-pay | Admitting: Physician Assistant

## 2021-08-09 ENCOUNTER — Other Ambulatory Visit: Payer: Self-pay | Admitting: Advanced Practice Midwife

## 2021-08-09 VITALS — BP 108/72 | HR 69 | Temp 98.1°F | Wt 155.4 lb

## 2021-08-09 DIAGNOSIS — Z3492 Encounter for supervision of normal pregnancy, unspecified, second trimester: Secondary | ICD-10-CM

## 2021-08-09 DIAGNOSIS — O09521 Supervision of elderly multigravida, first trimester: Secondary | ICD-10-CM

## 2021-08-09 DIAGNOSIS — O99212 Obesity complicating pregnancy, second trimester: Secondary | ICD-10-CM

## 2021-08-09 DIAGNOSIS — O0990 Supervision of high risk pregnancy, unspecified, unspecified trimester: Secondary | ICD-10-CM

## 2021-08-09 DIAGNOSIS — O99210 Obesity complicating pregnancy, unspecified trimester: Secondary | ICD-10-CM

## 2021-08-09 DIAGNOSIS — O0992 Supervision of high risk pregnancy, unspecified, second trimester: Secondary | ICD-10-CM | POA: Diagnosis not present

## 2021-08-09 NOTE — Progress Notes (Signed)
Patient here at 16w 4d for MH RV. Patient is aware of [facility] U/S 08/10/2021 at 1pm.   Patient declined QUAD screen, declination form signed.   Floy Sabina, RN

## 2021-08-09 NOTE — Progress Notes (Signed)
   PRENATAL VISIT NOTE  Subjective:  Kimberly Cherry is a 40 y.o. G6P5005 at [redacted]w[redacted]d being seen today for ongoing prenatal care.  She is currently monitored for the following issues for this high-risk pregnancy and has Advanced maternal age in multigravida, first trimester 40 yo; History of abnormal cervical Pap smear; Overweight (BMI 25.0-29.9); Supervision of high-risk pregnancy, unspecified trimester; Obesity affecting pregnancy, antepartum; and Grand multiparity on their problem list.  Patient reports no complaints.  Contractions: Not present. Vag. Bleeding: None.  Movement: Present. Denies leaking of fluid/ROM.   The following portions of the patient's history were reviewed and updated as appropriate: allergies, current medications, past family history, past medical history, past social history, past surgical history and problem list. Problem list updated.  Objective:   Vitals:   08/09/21 0921  BP: 108/72  Pulse: 69  Temp: 98.1 F (36.7 C)  Weight: 155 lb 6.4 oz (70.5 kg)    Fetal Status: Fetal Heart Rate (bpm): 144 Fundal Height: 16 cm Movement: Present     General:  Alert, oriented and cooperative. Patient is in no acute distress.  Skin: Skin is warm and dry. No rash noted.   Cardiovascular: Normal heart rate noted  Respiratory: Normal respiratory effort, no problems with respiration noted  Abdomen: Soft, gravid, appropriate for gestational age.  Pain/Pressure: Absent     Pelvic: Cervical exam deferred        Extremities: Normal range of motion.  Edema: None  Mental Status: Normal mood and affect. Normal behavior. Normal judgment and thought content.   Assessment and Plan:  Pregnancy: G6P5005 at [redacted]w[redacted]d  1. Supervision of high-risk pregnancy, unspecified trimester Doing well. Enc to keep [place] as sched tomorrow.  2. Advanced maternal age in multigravida, first trimester 40 yo Declines aneuploidy screening.  3. Obesity affecting pregnancy, antepartum Taking aspirin as  instructed. Wt loss noted, though pt denies N/V and describes appropriate po intake. Will continue to monitor.   Preterm labor symptoms and general obstetric precautions including but not limited to vaginal bleeding, contractions, leaking of fluid and fetal movement were reviewed in detail with the patient. Due to language barrier, an interpreter Juliene Pina) was present during the provider portion of the visit with this patient.  Please refer to After Visit Summary for other counseling recommendations.  Return in about 4 weeks (around 09/06/2021) for Routine prenatal care.  Future Appointments  Date Time Provider Department Center  08/10/2021  1:00 PM [facility]    Landry Dyke, [place]

## 2021-08-10 ENCOUNTER — Telehealth: Payer: Self-pay

## 2021-08-10 ENCOUNTER — Ambulatory Visit: Admission: RE | Admit: 2021-08-10 | Payer: Self-pay | Source: Ambulatory Visit

## 2021-08-10 NOTE — Telephone Encounter (Signed)
TC to [facility] to reschedule patient U/S. Received a call from [facility] during our Friday afternoon meeting that patient had arrived at [facility] expecting an U/S. This RN told [facility] admin to send patient to [facility] to see if they would do the U/S late or reschedule her. Patient now rescheduled for 08/14/2021 and is aware per Alvy Beal at [facility].Burt Knack, RN

## 2021-08-14 ENCOUNTER — Other Ambulatory Visit: Payer: Self-pay

## 2021-08-14 ENCOUNTER — Ambulatory Visit
Admission: RE | Admit: 2021-08-14 | Discharge: 2021-08-14 | Disposition: A | Discharge status: Home / Self Care | Payer: Self-pay | Source: Ambulatory Visit | Attending: Advanced Practice Midwife | Admitting: Advanced Practice Midwife

## 2021-08-14 DIAGNOSIS — Z3492 Encounter for supervision of normal pregnancy, unspecified, second trimester: Secondary | ICD-10-CM | POA: Insufficient documentation

## 2021-08-15 ENCOUNTER — Encounter: Payer: Self-pay | Admitting: Advanced Practice Midwife

## 2021-09-06 ENCOUNTER — Other Ambulatory Visit: Payer: Self-pay

## 2021-09-06 ENCOUNTER — Encounter: Payer: Self-pay | Admitting: Physician Assistant

## 2021-09-06 ENCOUNTER — Ambulatory Visit: Payer: Medicaid Other | Admitting: Physician Assistant

## 2021-09-06 VITALS — BP 101/65 | HR 81 | Temp 98.3°F | Wt 155.0 lb

## 2021-09-06 DIAGNOSIS — O99212 Obesity complicating pregnancy, second trimester: Secondary | ICD-10-CM

## 2021-09-06 DIAGNOSIS — O99210 Obesity complicating pregnancy, unspecified trimester: Secondary | ICD-10-CM

## 2021-09-06 DIAGNOSIS — O09522 Supervision of elderly multigravida, second trimester: Secondary | ICD-10-CM

## 2021-09-06 DIAGNOSIS — O0992 Supervision of high risk pregnancy, unspecified, second trimester: Secondary | ICD-10-CM

## 2021-09-06 DIAGNOSIS — O0990 Supervision of high risk pregnancy, unspecified, unspecified trimester: Secondary | ICD-10-CM

## 2021-09-06 DIAGNOSIS — O09521 Supervision of elderly multigravida, first trimester: Secondary | ICD-10-CM

## 2021-09-06 NOTE — Progress Notes (Signed)
[facility]  PRENATAL VISIT NOTE  Subjective:  Kimberly Cherry is a 40 y.o. [phone] at [redacted]w[redacted]d being seen today for ongoing prenatal care.  She is currently monitored for the following issues for this high-risk pregnancy and has Advanced maternal age in multigravida, first trimester 40 yo; History of abnormal cervical Pap smear; Overweight (BMI 25.0-29.9); Supervision of high-risk pregnancy, unspecified trimester; Obesity affecting pregnancy, antepartum; and Grand multiparity on their problem list.  Patient reports no complaints.  Contractions: Not present. Vag. Bleeding: None.  Movement: Present. Denies leaking of fluid/ROM.   The following portions of the patient's history were reviewed and updated as appropriate: allergies, current medications, past family history, past medical history, past social history, past surgical history and problem list. Problem list updated.  Objective:   Vitals:   09/06/21 1035  BP: 101/65  Pulse: 81  Temp: 98.3 F (36.8 C)  Weight: 155 lb (70.3 kg)    Fetal Status: Fetal Heart Rate (bpm): 144 Fundal Height: 22 cm Movement: Present     General:  Alert, oriented and cooperative. Patient is in no acute distress.  Skin: Skin is warm and dry. No rash noted.   Cardiovascular: Normal heart rate noted  Respiratory: Normal respiratory effort, no problems with respiration noted  Abdomen: Soft, gravid, appropriate for gestational age.  Pain/Pressure: Absent     Pelvic: Cervical exam deferred        Extremities: Normal range of motion.  Edema: None  Mental Status: Normal mood and affect. Normal behavior. Normal judgment and thought content.   Assessment and Plan:  Pregnancy: G6P5005 at [redacted]w[redacted]d  1. Supervision of high-risk pregnancy, unspecified trimester Feeling well. Reveiwed normal fetal anat [place] with pt (it's a girl, which will make 3 girls and 3 boys in family - she is very happy about this.) Agrees to annual  flu shot today.  2. Obesity affecting pregnancy, antepartum Still no interval weight gain despite appropriate healthy intake (content/portions) and no N/V or diarrhea. 10lb wt loss for total preg (if initial pre-preg wt is correct.) Fetal growth seems appropriate, will continue to monitor. Continue aspirin.  3. Advanced maternal age in multigravida, first trimester 40 yo Continue aspirin.   Preterm labor symptoms and general obstetric precautions including but not limited to vaginal bleeding, contractions, leaking of fluid and fetal movement were reviewed in detail with the patient. Due to language barrier, an interpreter Juliene Pina) was present during the provider portion of the visit with this patient.' Please refer to After Visit Summary for other counseling recommendations.  Return in about 4 weeks (around 10/04/2021) for Routine prenatal care.  Future Appointments  Date Time Provider Department Center  10/04/2021 10:00 AM [facility] PROVIDER [facility] None    Landry Dyke, PA-C

## 2021-09-06 NOTE — Progress Notes (Signed)
Pregnancy Care Home forms completed. Jossie Ng, RN

## 2021-10-04 ENCOUNTER — Ambulatory Visit: Payer: Self-pay | Admitting: Physician Assistant

## 2021-10-04 ENCOUNTER — Other Ambulatory Visit: Payer: Self-pay

## 2021-10-04 ENCOUNTER — Encounter: Payer: Self-pay | Admitting: Physician Assistant

## 2021-10-04 VITALS — BP 107/72 | HR 78 | Temp 97.7°F | Wt 160.8 lb

## 2021-10-04 DIAGNOSIS — O0992 Supervision of high risk pregnancy, unspecified, second trimester: Secondary | ICD-10-CM

## 2021-10-04 DIAGNOSIS — O99212 Obesity complicating pregnancy, second trimester: Secondary | ICD-10-CM

## 2021-10-04 DIAGNOSIS — O09522 Supervision of elderly multigravida, second trimester: Secondary | ICD-10-CM

## 2021-10-04 DIAGNOSIS — O99210 Obesity complicating pregnancy, unspecified trimester: Secondary | ICD-10-CM

## 2021-10-04 DIAGNOSIS — O09521 Supervision of elderly multigravida, first trimester: Secondary | ICD-10-CM

## 2021-10-04 DIAGNOSIS — O0990 Supervision of high risk pregnancy, unspecified, unspecified trimester: Secondary | ICD-10-CM

## 2021-10-04 MED ORDER — PRENATAL VITAMIN 27-0.8 MG PO TABS: 1.0000 | ORAL_TABLET | Freq: Every day | ORAL | 0 refills | Status: DC

## 2021-10-04 NOTE — Progress Notes (Signed)
[facility]  PRENATAL VISIT NOTE  Subjective:  Kimberly Cherry is a 40 y.o. [phone] at [redacted]w[redacted]d being seen today for ongoing prenatal care.  She is currently monitored for the following issues for this high-risk pregnancy and has Advanced maternal age in multigravida, first trimester 40 yo; History of abnormal cervical Pap smear; Overweight (BMI 25.0-29.9); Supervision of high-risk pregnancy, unspecified trimester; Obesity affecting pregnancy, antepartum; and Grand multiparity on their problem list.  Patient reports no complaints.  Contractions: Not present. Vag. Bleeding: None.  Movement: Present. Denies leaking of fluid/ROM.   The following portions of the patient's history were reviewed and updated as appropriate: allergies, current medications, past family history, past medical history, past social history, past surgical history and problem list. Problem list updated.  Objective:   Vitals:   10/04/21 1002  BP: 107/72  Pulse: 78  Temp: 97.7 F (36.5 C)  Weight: 160 lb 12.8 oz (72.9 kg)    Fetal Status: Fetal Heart Rate (bpm): 152 Fundal Height: 27 cm Movement: Present     General:  Alert, oriented and cooperative. Patient is in no acute distress.  Skin: Skin is warm and dry. No rash noted.   Cardiovascular: Normal heart rate noted  Respiratory: Normal respiratory effort, no problems with respiration noted  Abdomen: Soft, gravid, appropriate for gestational age.  Pain/Pressure: Absent     Pelvic: Cervical exam deferred        Extremities: Normal range of motion.  Edema: None  Mental Status: Normal mood and affect. Normal behavior. Normal judgment and thought content.   Assessment and Plan:  Pregnancy: G6P5005 at [redacted]w[redacted]d  1. Supervision of high-risk pregnancy, unspecified trimester Doing well. Anticipatory guidance re: 28-wk labs at next visit. - Prenatal Vit-Fe Fumarate-FA (PRENATAL VITAMIN) 27-0.8 MG TABS; Take 1 tablet by mouth  daily.  Dispense: 100 tablet; Refill: 0  2. Obesity affecting pregnancy, antepartum Interval wt gain noted, still with small net wt loss for pregnancy. Will continue to monitor. Appropriate fetal growth by fundal height. Continue daily aspirin.  3. Advanced maternal age in multigravida, first trimester 40 yo Continue daily aspirin.   Preterm labor symptoms and general obstetric precautions including but not limited to vaginal bleeding, contractions, leaking of fluid and fetal movement were reviewed in detail with the patient. Due to language barrier, an interpreter (M. [place]) was present during the provider portion of the visit with this patient.' Please refer to After Visit Summary for other counseling recommendations.  Return in about 2 weeks (around 10/18/2021) for Routine prenatal care, 28 wk labs (before 10:30am or 3pm).  Future Appointments  Date Time Provider Department Center  10/18/2021  3:20 PM [facility] PROVIDER [facility] None    Landry Dyke, PA-C

## 2021-10-04 NOTE — Progress Notes (Signed)
Patient here for MH RV at 26 3/7. Needs more PNV. Aware of 28 week labs next RV.Marland KitchenBurt Knack, RN

## 2021-10-18 ENCOUNTER — Other Ambulatory Visit: Payer: Self-pay

## 2021-10-18 ENCOUNTER — Ambulatory Visit: Payer: Self-pay | Admitting: Advanced Practice Midwife

## 2021-10-18 VITALS — BP 116/64 | HR 89 | Temp 98.5°F | Wt 161.8 lb

## 2021-10-18 DIAGNOSIS — O0990 Supervision of high risk pregnancy, unspecified, unspecified trimester: Secondary | ICD-10-CM

## 2021-10-18 DIAGNOSIS — Z23 Encounter for immunization: Secondary | ICD-10-CM

## 2021-10-18 DIAGNOSIS — O99210 Obesity complicating pregnancy, unspecified trimester: Secondary | ICD-10-CM

## 2021-10-18 DIAGNOSIS — O09523 Supervision of elderly multigravida, third trimester: Secondary | ICD-10-CM

## 2021-10-18 DIAGNOSIS — O09521 Supervision of elderly multigravida, first trimester: Secondary | ICD-10-CM

## 2021-10-18 DIAGNOSIS — Z641 Problems related to multiparity: Secondary | ICD-10-CM

## 2021-10-18 DIAGNOSIS — O0993 Supervision of high risk pregnancy, unspecified, third trimester: Secondary | ICD-10-CM

## 2021-10-18 DIAGNOSIS — Z8742 Personal history of other diseases of the female genital tract: Secondary | ICD-10-CM

## 2021-10-18 DIAGNOSIS — O99213 Obesity complicating pregnancy, third trimester: Secondary | ICD-10-CM

## 2021-10-18 LAB — HEMOGLOBIN, FINGERSTICK: Hemoglobin: 11.6 g/dL (ref 11.1–15.9)

## 2021-10-18 NOTE — Progress Notes (Addendum)
Patient here for MH RV at 28w 3d.   Tdap given and NCIR handed to patient.  Patient due for 1 hr gtt draw at 16:15.   Hgb reviewed during clinic visit. WNL.   Floy Sabina, RN

## 2021-10-18 NOTE — Progress Notes (Signed)
[facility]  PRENATAL VISIT NOTE  Subjective:  Kimberly Cherry is a 40 y.o. G6P5005 at [redacted]w[redacted]d being seen today for ongoing prenatal care.  She is currently monitored for the following issues for this high-risk pregnancy and has Advanced maternal age in multigravida, first trimester 40 yo; History of abnormal cervical Pap smear; Supervision of high-risk pregnancy, unspecified trimester; Obesity affecting pregnancy, antepartum; and Grand multiparity on their problem list.  Patient reports no complaints.  Contractions: Not present. Vag. Bleeding: None.  Movement: Present. Denies leaking of fluid/ROM.   The following portions of the patient's history were reviewed and updated as appropriate: allergies, current medications, past family history, past medical history, past social history, past surgical history and problem list. Problem list updated.  Objective:   Vitals:   10/18/21 1506  BP: 116/64  Pulse: 89  Temp: 98.5 F (36.9 C)  Weight: 161 lb 12.8 oz (73.4 kg)    Fetal Status: Fetal Heart Rate (bpm): 140 Fundal Height: 27 cm Movement: Present     General:  Alert, oriented and cooperative. Patient is in no acute distress.  Skin: Skin is warm and dry. No rash noted.   Cardiovascular: Normal heart rate noted  Respiratory: Normal respiratory effort, no problems with respiration noted  Abdomen: Soft, gravid, appropriate for gestational age.  Pain/Pressure: Absent     Pelvic: Cervical exam deferred        Extremities: Normal range of motion.  Edema: None  Mental Status: Normal mood and affect. Normal behavior. Normal judgment and thought content.   Assessment and Plan:  Pregnancy: G6P5005 at [redacted]w[redacted]d  1. Supervision of high-risk pregnancy, unspecified trimester Feels well. 28 wk labs today with 1 hour glucola. Not working 1 hour glucola=88 on 07/12/21 Last pap 01/20/20 neg HPV neg Reviewed 08/14/21 u/s with normal anatomy, posterior  placenta, vtx, 3VC, AFI wnl at 19 1/7 - Hemoglobin, fingerstick - Glucose, 1 hour gestational - HIV-1/HIV-2 Qualitative RNA - RPR - Tdap vaccine greater than or equal to 7yo IM  2. Grand multiparity   3. Obesity affecting pregnancy, antepartum Taking ASA 81 mg daily -3 lb 3.2 oz (-1.452 kg) 1 lb wt gain in past 2 weeks Not exercising--encouraged walking outside 3x/wk x 20 min  4. Advanced maternal age in multigravida, first trimester 40 yo   5. History of abnormal cervical Pap smear Needs next pap 01/2025   Preterm labor symptoms and general obstetric precautions including but not limited to vaginal bleeding, contractions, leaking of fluid and fetal movement were reviewed in detail with the patient. Please refer to After Visit Summary for other counseling recommendations.  Return in about 2 weeks (around 11/01/2021) for routine PNC.  No future appointments.  Alberteen Spindle, CNM

## 2021-10-19 LAB — GLUCOSE, 1 HOUR GESTATIONAL: Gestational Diabetes Screen: 129 mg/dL (ref 70–139)

## 2021-10-19 LAB — RPR: RPR Ser Ql: NONREACTIVE

## 2021-10-20 LAB — HIV-1/HIV-2 QUALITATIVE RNA
HIV-1 RNA, Qualitative: NONREACTIVE
HIV-2 RNA, Qualitative: NONREACTIVE

## 2021-11-05 ENCOUNTER — Ambulatory Visit: Payer: Self-pay | Admitting: Advanced Practice Midwife

## 2021-11-05 ENCOUNTER — Other Ambulatory Visit: Payer: Self-pay

## 2021-11-05 VITALS — BP 108/68 | HR 90 | Temp 97.9°F | Wt 162.4 lb

## 2021-11-05 DIAGNOSIS — O09521 Supervision of elderly multigravida, first trimester: Secondary | ICD-10-CM

## 2021-11-05 DIAGNOSIS — O0993 Supervision of high risk pregnancy, unspecified, third trimester: Secondary | ICD-10-CM

## 2021-11-05 DIAGNOSIS — O09523 Supervision of elderly multigravida, third trimester: Secondary | ICD-10-CM

## 2021-11-05 DIAGNOSIS — O99213 Obesity complicating pregnancy, third trimester: Secondary | ICD-10-CM

## 2021-11-05 DIAGNOSIS — O99210 Obesity complicating pregnancy, unspecified trimester: Secondary | ICD-10-CM

## 2021-11-05 DIAGNOSIS — O0990 Supervision of high risk pregnancy, unspecified, unspecified trimester: Secondary | ICD-10-CM

## 2021-11-05 NOTE — Progress Notes (Signed)
Patient here for MH RV at 31 weeks. Kick counts reviewed and cards given.Burt Knack, RN

## 2021-11-05 NOTE — Progress Notes (Signed)
[facility] [place] [facility]  PRENATAL VISIT NOTE  Subjective:  Kimberly Cherry is a 40 y.o. G6P5005 at [redacted]w[redacted]d being seen today for ongoing prenatal care.  She is currently monitored for the following issues for this high-risk pregnancy and has Advanced maternal age in multigravida, first trimester 40 yo; History of abnormal cervical Pap smear; Supervision of high-risk pregnancy, unspecified trimester; Obesity affecting pregnancy, antepartum; and Grand multiparity G6P5 on their problem list.  Patient reports no complaints.  Contractions: Not present. Vag. Bleeding: None.  Movement: Present. Denies leaking of fluid/ROM.   The following portions of the patient's history were reviewed and updated as appropriate: allergies, current medications, past family history, past medical history, past social history, past surgical history and problem list. Problem list updated.  Objective:   Vitals:   11/05/21 1404  BP: 108/68  Pulse: 90  Temp: 97.9 F (36.6 C)  Weight: 162 lb 6.4 oz (73.7 kg)    Fetal Status: Fetal Heart Rate (bpm): 130 Fundal Height: 31 cm Movement: Present     General:  Alert, oriented and cooperative. Patient is in no acute distress.  Skin: Skin is warm and dry. No rash noted.   Cardiovascular: Normal heart rate noted  Respiratory: Normal respiratory effort, no problems with respiration noted  Abdomen: Soft, gravid, appropriate for gestational age.  Pain/Pressure: Absent     Pelvic: Cervical exam deferred        Extremities: Normal range of motion.  Edema: None  Mental Status: Normal mood and affect. Normal behavior. Normal judgment and thought content.   Assessment and Plan:  Pregnancy: G6P5005 at [redacted]w[redacted]d  1. Obesity affecting pregnancy, antepartum -2 lb 9.6 oz (-1.179 kg) Walking now 5x/wk x 30 min Taking ASA 81 mg daily  2. Supervision of high-risk pregnancy, unspecified trimester Not employed 1 hour glucola on 10/18/21=129  3.  Advanced maternal age in multigravida, first trimester 40 yo Declined genetic screening   Preterm labor symptoms and general obstetric precautions including but not limited to vaginal bleeding, contractions, leaking of fluid and fetal movement were reviewed in detail with the patient. Please refer to After Visit Summary for other counseling recommendations.  No follow-ups on file.  No future appointments.  Alberteen Spindle, CNM

## 2021-11-19 ENCOUNTER — Other Ambulatory Visit: Payer: Self-pay

## 2021-11-19 ENCOUNTER — Ambulatory Visit: Payer: Self-pay | Admitting: Advanced Practice Midwife

## 2021-11-19 VITALS — BP 104/73 | HR 90 | Temp 98.1°F | Wt 162.4 lb

## 2021-11-19 DIAGNOSIS — O09523 Supervision of elderly multigravida, third trimester: Secondary | ICD-10-CM

## 2021-11-19 DIAGNOSIS — O0993 Supervision of high risk pregnancy, unspecified, third trimester: Secondary | ICD-10-CM

## 2021-11-19 DIAGNOSIS — O09521 Supervision of elderly multigravida, first trimester: Secondary | ICD-10-CM

## 2021-11-19 DIAGNOSIS — O99210 Obesity complicating pregnancy, unspecified trimester: Secondary | ICD-10-CM

## 2021-11-19 DIAGNOSIS — Z641 Problems related to multiparity: Secondary | ICD-10-CM

## 2021-11-19 DIAGNOSIS — O99213 Obesity complicating pregnancy, third trimester: Secondary | ICD-10-CM

## 2021-11-19 DIAGNOSIS — O0990 Supervision of high risk pregnancy, unspecified, unspecified trimester: Secondary | ICD-10-CM

## 2021-11-19 NOTE — Progress Notes (Signed)
[facility]  PRENATAL VISIT NOTE  Subjective:  Kimberly Cherry is a 40 y.o. G6P5005 at [redacted]w[redacted]d being seen today for ongoing prenatal care.  She is currently monitored for the following issues for this high-risk pregnancy and has Advanced maternal age in multigravida, first trimester 40 yo; History of abnormal cervical Pap smear; Supervision of high-risk pregnancy, unspecified trimester; Obesity affecting pregnancy, antepartum; and Grand multiparity G6P5 on their problem list.  Patient reports no complaints.  Contractions: Not present. Vag. Bleeding: None.  Movement: Present. Denies leaking of fluid/ROM.   The following portions of the patient's history were reviewed and updated as appropriate: allergies, current medications, past family history, past medical history, past social history, past surgical history and problem list. Problem list updated.  Objective:   Vitals:   11/19/21 0839  BP: 104/73  Pulse: 90  Temp: 98.1 F (36.7 C)  Weight: 162 lb 6.4 oz (73.7 kg)    Fetal Status: Fetal Heart Rate (bpm): 140 Fundal Height: 33 cm Movement: Present     General:  Alert, oriented and cooperative. Patient is in no acute distress.  Skin: Skin is warm and dry. No rash noted.   Cardiovascular: Normal heart rate noted  Respiratory: Normal respiratory effort, no problems with respiration noted  Abdomen: Soft, gravid, appropriate for gestational age.  Pain/Pressure: Absent     Pelvic: Cervical exam deferred        Extremities: Normal range of motion.  Edema: None  Mental Status: Normal mood and affect. Normal behavior. Normal judgment and thought content.   Assessment and Plan:  Pregnancy: G6P5005 at [redacted]w[redacted]d  1. Advanced maternal age in multigravida, first trimester 40 yo   2. Supervision of high-risk pregnancy, unspecified trimester Ready for baby at home; has car seat Not working [employer] well  3. Obesity affecting pregnancy,  antepartum -2 lb 9.6 oz (-1.179 kg) Taking ASA 81 mg daily Walking 4-5x/wk x 45 min  4. Grand multiparity G6P5    Preterm labor symptoms and general obstetric precautions including but not limited to vaginal bleeding, contractions, leaking of fluid and fetal movement were reviewed in detail with the patient. Please refer to After Visit Summary for other counseling recommendations.  Return in about 2 weeks (around 12/03/2021) for routine PNC.  Future Appointments  Date Time Provider Department Center  12/05/2021 10:20 AM [facility] PROVIDER [facility] None    Alberteen Spindle, CNM

## 2021-11-19 NOTE — Progress Notes (Signed)
Patient here for MH RV at 33 weeks. Burt Knack, RN

## 2021-12-05 ENCOUNTER — Ambulatory Visit: Payer: Self-pay

## 2021-12-06 ENCOUNTER — Ambulatory Visit: Payer: Self-pay | Admitting: Physician Assistant

## 2021-12-06 ENCOUNTER — Other Ambulatory Visit: Payer: Self-pay

## 2021-12-06 ENCOUNTER — Encounter: Payer: Self-pay | Admitting: Physician Assistant

## 2021-12-06 VITALS — BP 114/78 | HR 89 | Temp 98.0°F | Wt 165.8 lb

## 2021-12-06 DIAGNOSIS — O99213 Obesity complicating pregnancy, third trimester: Secondary | ICD-10-CM

## 2021-12-06 DIAGNOSIS — O0990 Supervision of high risk pregnancy, unspecified, unspecified trimester: Secondary | ICD-10-CM

## 2021-12-06 DIAGNOSIS — O0993 Supervision of high risk pregnancy, unspecified, third trimester: Secondary | ICD-10-CM

## 2021-12-06 DIAGNOSIS — O99210 Obesity complicating pregnancy, unspecified trimester: Secondary | ICD-10-CM

## 2021-12-06 MED ORDER — PRENATAL VITAMIN 27-0.8 MG PO TABS
1.0000 | ORAL_TABLET | Freq: Every day | ORAL | 0 refills | Status: DC
Start: 2021-12-06 — End: 2023-03-02

## 2021-12-06 NOTE — Progress Notes (Signed)
Patient here for MH RV at 35 3/7. Counseled to stop taking ASA at 36 weeks. Needs more PNV today, given.Burt Knack, RN

## 2021-12-06 NOTE — Progress Notes (Signed)
[facility]  PRENATAL VISIT NOTE  Subjective:  Kimberly Cherry is a 40 y.o. [phone] at [redacted]w[redacted]d being seen today for ongoing prenatal care.  She is currently monitored for the following issues for this high-risk pregnancy and has Advanced maternal age in multigravida, first trimester 40 yo; History of abnormal cervical Pap smear; Supervision of high-risk pregnancy, unspecified trimester; Obesity affecting pregnancy, antepartum; and Grand multiparity G6P5 on their problem list.  Patient reports no complaints.  Contractions: Not present. Vag. Bleeding: None.  Movement: Present. She notes a thick white vag discharge without odor or itching - it is not bothersome. Denies leaking of fluid/ROM.   The following portions of the patient's history were reviewed and updated as appropriate: allergies, current medications, past family history, past medical history, past social history, past surgical history and problem list. Problem list updated.  Objective:   Vitals:   12/06/21 1117  BP: 114/78  Pulse: 89  Temp: 98 F (36.7 C)  Weight: 165 lb 12.8 oz (75.2 kg)    Fetal Status: Fetal Heart Rate (bpm): 152 Fundal Height: 35 cm Movement: Present     General:  Alert, oriented and cooperative. Patient is in no acute distress.  Skin: Skin is warm and dry. No rash noted.   Cardiovascular: Normal heart rate noted  Respiratory: Normal respiratory effort, no problems with respiration noted  Abdomen: Soft, gravid, appropriate for gestational age.  Pain/Pressure: Absent     Pelvic: Cervical exam deferred        Extremities: Normal range of motion.  Edema: None  Mental Status: Normal mood and affect. Normal behavior. Normal judgment and thought content.   Assessment and Plan:  Pregnancy: G6P5005 at [redacted]w[redacted]d  1. Supervision of high-risk pregnancy, unspecified trimester Doing well. Reassurance re: vaginal discharge likely normal. Anticipatory guidance re:  routine rectovaginal testing planned at 36-week visit. - Prenatal Vit-Fe Fumarate-FA (PRENATAL VITAMIN) 27-0.8 MG TABS; Take 1 tablet by mouth daily.  Dispense: 100 tablet; Refill: 0  2. Obesity affecting pregnancy, antepartum Continue low-dose aspirin til 12/10/21. TWG for preg less than 1 lb.   Preterm labor symptoms and general obstetric precautions including but not limited to vaginal bleeding, contractions, leaking of fluid and fetal movement were reviewed in detail with the patient. Due to language barrier, an interpreter (M. [place]) was present during the provider portion of the visit with this patient.  Please refer to After Visit Summary for other counseling recommendations.  Return in about 1 week (around 12/13/2021) for Routine prenatal care, 36 wk labs.  Future Appointments  Date Time Provider Department Center  12/13/2021  9:00 AM [facility] PROVIDER [facility] None    Landry Dyke, PA-C

## 2021-12-09 NOTE — L&D Delivery Note (Signed)
Delivery Note  Date of delivery: 01/06/2022 Estimated Date of Delivery: 01/07/22 Patient's last menstrual period was 04/15/2021 (approximate). EGA: [redacted]w[redacted]d  Delivery Note At 4:32 AM a viable female was delivered via Vaginal, Spontaneous (Presentation: LOA).  APGAR: 9, 9; weight pending.  Placenta status: Spontaneous, Intact.  Cord: 3 vessels with the following complications: None.    First Stage: Labor onset: unknown Augmentation : none Analgesia /Anesthesia intrapartum: none SROM at 0324  Kimberly Cherry presented to L&D with SROM and uterine contractions.    Second Stage: Complete dilation at 0430 Onset of pushing at 0430 FHR second stage Cat I Delivery at 0432 on 01/06/2022  She progressed to complete and had a spontaneous vaginal birth of a live female over an intact perineum. The fetal head was delivered in OA position with restitution to LOA. Loose nuchal cord noted and resolved with summersault maneuver. Anterior then posterior shoulders delivered spontaneously. Baby placed on mom's abdomen and attended to by transition RN. Cord clamped and cut at 1 min by FOB.   Third Stage: Placenta delivered intact with 3VC at 0433 Placenta disposition: routine disposal Uterine tone firm / bleeding mod - with history of PPH and moderate bleeding TXA given IV pitocin given for hemorrhage prophylaxis  Anesthesia: None Episiotomy: None Lacerations: 1st degree, did not require repair Suture Repair: n/a Est. Blood Loss (mL): 685   Complications: none  Mom to postpartum.  Baby to Couplet care / Skin to Skin.  Newborn: Birth Weight: (4160g) 9lb 2.7oz  Apgar Scores: 9, 9 Feeding planned: Both   Kimberly Cherry, [place] 01/06/2022 4:49 AM

## 2021-12-13 ENCOUNTER — Encounter: Payer: Self-pay | Admitting: Physician Assistant

## 2021-12-13 ENCOUNTER — Other Ambulatory Visit: Payer: Self-pay

## 2021-12-13 ENCOUNTER — Ambulatory Visit: Payer: Self-pay | Admitting: Physician Assistant

## 2021-12-13 VITALS — BP 111/76 | HR 88 | Temp 98.2°F | Wt 165.0 lb

## 2021-12-13 DIAGNOSIS — O99210 Obesity complicating pregnancy, unspecified trimester: Secondary | ICD-10-CM

## 2021-12-13 DIAGNOSIS — O0993 Supervision of high risk pregnancy, unspecified, third trimester: Secondary | ICD-10-CM

## 2021-12-13 DIAGNOSIS — O99213 Obesity complicating pregnancy, third trimester: Secondary | ICD-10-CM

## 2021-12-13 DIAGNOSIS — O09521 Supervision of elderly multigravida, first trimester: Secondary | ICD-10-CM

## 2021-12-13 DIAGNOSIS — O09523 Supervision of elderly multigravida, third trimester: Secondary | ICD-10-CM

## 2021-12-13 LAB — WET PREP FOR TRICH, YEAST, CLUE
Trichomonas Exam: NEGATIVE
Yeast Exam: NEGATIVE

## 2021-12-13 NOTE — Progress Notes (Signed)
[facility]  PRENATAL VISIT NOTE  Subjective:  Kimberly Cherry is a 41 y.o. [phone] at [redacted]w[redacted]d being seen today for ongoing prenatal care.  She is currently monitored for the following issues for this high-risk pregnancy and has Advanced maternal age in multigravida, first trimester 41 yo; History of abnormal cervical Pap smear; Supervision of high-risk pregnancy, unspecified trimester; Obesity affecting pregnancy, antepartum; and [place] multiparity G6P5 on their problem list.  Patient reports  3 day h/o vaginal itching and thick white discharge .  Contractions: Irregular. Vag. Bleeding: None.  Movement: Present. Having contractions irregularly, last >12 hours ago. Denies leaking of fluid/ROM.   The following portions of the patient's history were reviewed and updated as appropriate: allergies, current medications, past family history, past medical history, past social history, past surgical history and problem list. Problem list updated.  Objective:   Vitals:   12/13/21 0903  BP: 111/76  Pulse: 88  Temp: 98.2 F (36.8 C)  Weight: 165 lb (74.8 kg)    Fetal Status: Fetal Heart Rate (bpm): 132 Fundal Height: 36 cm Movement: Present  Presentation: Vertex  General:  Alert, oriented and cooperative. Patient is in no acute distress.  Skin: Skin is warm and dry. No rash noted.   Cardiovascular: Normal heart rate noted  Respiratory: Normal respiratory effort, no problems with respiration noted  Abdomen: Soft, gravid, appropriate for gestational age.  Pain/Pressure: Absent     Pelvic: Cervical exam deferred        Extremities: Normal range of motion.  Edema: None  Mental Status: Normal mood and affect. Normal behavior. Normal judgment and thought content.   Assessment and Plan:  Pregnancy: G6P5005 at [redacted]w[redacted]d  1. High-risk pregnancy in third trimester Had mod amt thick white vag discharge, pH <4.5. Wet prep done = neg, no tx indicated. Routine  36-wk testing today. Counseled re: weekly visits until delivery. - Chlamydia/GC NAA, Confirmation - Culture, beta strep (group b only) - WET PREP FOR TRICH, YEAST, CLUE  2. Obesity affecting pregnancy, antepartum TWG 0 lb despite good po intake.  3. Advanced maternal age in multigravida, first trimester 41 yo Doing well.   Preterm labor symptoms and general obstetric precautions including but not limited to vaginal bleeding, contractions, leaking of fluid and fetal movement were reviewed in detail with the patient. Due to language barrier, an interpreter Juliene Pina) was present during the provider portion of the visit with this patient.  Please refer to After Visit Summary for other counseling recommendations.  Return in about 1 week (around 12/20/2021) for Routine prenatal care.  Future Appointments  Date Time Provider Department Center  12/20/2021  3:40 PM [facility] PROVIDER [facility] None    Landry Dyke, PA-C

## 2021-12-13 NOTE — Progress Notes (Signed)
Wet mount negative. Reviewed during clinic visit with provider.   Patient aware to schedule visit in 1 week for MH RV.   Floy Sabina, RN

## 2021-12-15 LAB — CHLAMYDIA/GC NAA, CONFIRMATION
Chlamydia trachomatis, NAA: NEGATIVE
Neisseria gonorrhoeae, NAA: NEGATIVE

## 2021-12-17 LAB — CULTURE, BETA STREP (GROUP B ONLY): Strep Gp B Culture: NEGATIVE

## 2021-12-20 ENCOUNTER — Ambulatory Visit: Payer: Self-pay | Admitting: Nurse Practitioner

## 2021-12-20 ENCOUNTER — Encounter: Payer: Self-pay | Admitting: Nurse Practitioner

## 2021-12-20 ENCOUNTER — Other Ambulatory Visit: Payer: Self-pay

## 2021-12-20 ENCOUNTER — Telehealth: Payer: Self-pay | Admitting: Family Medicine

## 2021-12-20 VITALS — BP 117/80 | HR 87 | Temp 97.5°F | Wt 165.8 lb

## 2021-12-20 DIAGNOSIS — O0990 Supervision of high risk pregnancy, unspecified, unspecified trimester: Secondary | ICD-10-CM

## 2021-12-20 DIAGNOSIS — O99210 Obesity complicating pregnancy, unspecified trimester: Secondary | ICD-10-CM

## 2021-12-20 DIAGNOSIS — O0993 Supervision of high risk pregnancy, unspecified, third trimester: Secondary | ICD-10-CM

## 2021-12-20 DIAGNOSIS — O99213 Obesity complicating pregnancy, third trimester: Secondary | ICD-10-CM

## 2021-12-20 NOTE — Telephone Encounter (Signed)
Returned patient phone call. Patient scheduled MH RV for 01/01/2022 @ 10:40 with arrival time of 10:20. Hurshel Party, RN assisted with phone call. Tawny Hopping, RN

## 2021-12-20 NOTE — Telephone Encounter (Signed)
Pt. Needs a FU appt for 1 week, but we do not have anything available. Please call pt. Or clerk to figure out a convenient time. Thanks

## 2021-12-20 NOTE — Progress Notes (Signed)
[place] Department [facility]  PRENATAL VISIT NOTE  Subjective:  Kimberly Cherry is a 41 y.o. [phone] at [redacted]w[redacted]d being seen today for ongoing prenatal care.  She is currently monitored for the following issues for this high-risk pregnancy and has Advanced maternal age in multigravida, first trimester 41 yo; History of abnormal cervical Pap smear; Supervision of high-risk pregnancy, unspecified trimester; Obesity affecting pregnancy, antepartum; and [place] multiparity G6P5 on their problem list.  Patient reports no complaints.  Contractions: Irritability. Vag. Bleeding: None.  Movement: Present. Denies leaking of fluid/ROM.   The following portions of the patient's history were reviewed and updated as appropriate: allergies, current medications, past family history, past medical history, past social history, past surgical history and problem list. Problem list updated.  Objective:   Vitals:   12/20/21 1531  BP: 117/80  Pulse: 87  Temp: (!) 97.5 F (36.4 C)  Weight: 165 lb 12.8 oz (75.2 kg)    Fetal Status: Fetal Heart Rate (bpm): 140 Fundal Height: 37 cm Movement: Present  Presentation: Vertex  General:  Alert, oriented and cooperative. Patient is in no acute distress.  Skin: Skin is warm and dry. No rash noted.   Cardiovascular: Normal heart rate noted  Respiratory: Normal respiratory effort, no problems with respiration noted  Abdomen: Soft, gravid, appropriate for gestational age.  Pain/Pressure: Absent     Pelvic: Cervical exam deferred        Extremities: Normal range of motion.  Edema: None  Mental Status: Normal mood and affect. Normal behavior. Normal judgment and thought content.   Assessment and Plan:  Pregnancy: [id number] at [redacted]w[redacted]d  1. Supervision of high-risk pregnancy, unspecified trimester -41 year old female in clinic today for routine prenatal care.  No complaints today.  -36 week labs normal from 12/13/21 visit. -Patient states she is  taking PNV daily -Reinnervated signs to report to the hospital.   2. Obesity affecting pregnancy, antepartum -Total weight gain within normal limits. Encouraged frequents exercising and eating frequent small meals.    Term labor symptoms and general obstetric precautions including but not limited to vaginal bleeding, contractions, leaking of fluid and fetal movement were reviewed in detail with the patient. Please refer to After Visit Summary for other counseling recommendations.    Return in about 1 week (around 12/27/2021).  No future appointments.  Gregary Cromer, FNP

## 2021-12-20 NOTE — Progress Notes (Signed)
Here today for 37.2 week MH RV. Taking PNV QD. Denies ED/hospital visits since last RV. Briya Lookabaugh, RN  

## 2022-01-01 ENCOUNTER — Ambulatory Visit: Payer: Self-pay | Admitting: Advanced Practice Midwife

## 2022-01-01 ENCOUNTER — Other Ambulatory Visit: Payer: Self-pay

## 2022-01-01 VITALS — BP 118/77 | HR 73 | Temp 97.2°F | Wt 168.2 lb

## 2022-01-01 DIAGNOSIS — O09521 Supervision of elderly multigravida, first trimester: Secondary | ICD-10-CM

## 2022-01-01 DIAGNOSIS — O99210 Obesity complicating pregnancy, unspecified trimester: Secondary | ICD-10-CM

## 2022-01-01 DIAGNOSIS — O0990 Supervision of high risk pregnancy, unspecified, unspecified trimester: Secondary | ICD-10-CM

## 2022-01-01 DIAGNOSIS — Z641 Problems related to multiparity: Secondary | ICD-10-CM

## 2022-01-01 DIAGNOSIS — O99213 Obesity complicating pregnancy, third trimester: Secondary | ICD-10-CM

## 2022-01-01 DIAGNOSIS — O09523 Supervision of elderly multigravida, third trimester: Secondary | ICD-10-CM

## 2022-01-01 DIAGNOSIS — O0993 Supervision of high risk pregnancy, unspecified, third trimester: Secondary | ICD-10-CM

## 2022-01-01 NOTE — Progress Notes (Signed)
[place] Department [facility]  PRENATAL VISIT NOTE  Subjective:  Kimberly Cherry is a 41 y.o. [phone] at [redacted]w[redacted]d being seen today for ongoing prenatal care.  She is currently monitored for the following issues for this high-risk pregnancy and has Advanced maternal age in multigravida, first trimester 41 yo; History of abnormal cervical Pap smear; Supervision of high-risk pregnancy, unspecified trimester; Obesity affecting pregnancy, antepartum; and [place] multiparity G6P5 on their problem list.  Patient reports no complaints.   .  .   . Denies leaking of fluid/ROM.   The following portions of the patient's history were reviewed and updated as appropriate: allergies, current medications, past family history, past medical history, past social history, past surgical history and problem list. Problem list updated.  Objective:   Vitals:   01/01/22 1033  BP: 118/77  Pulse: 73  Temp: (!) 97.2 F (36.2 C)  Weight: 168 lb 3.2 oz (76.3 kg)    Fetal Status:           General:  Alert, oriented and cooperative. Patient is in no acute distress.  Skin: Skin is warm and dry. No rash noted.   Cardiovascular: Normal heart rate noted  Respiratory: Normal respiratory effort, no problems with respiration noted  Abdomen: Soft, gravid, appropriate for gestational age.        Pelvic: Cervical exam performed        Extremities: Normal range of motion.     Mental Status: Normal mood and affect. Normal behavior. Normal judgment and thought content.   Assessment and Plan:  Pregnancy: [id number] at [redacted]w[redacted]d  1. Supervision of high-risk pregnancy, unspecified trimester Knows when to go to L&D Has car seat and ready for baby at home Here with 4 yo who is not speaking; his peds said he needs speech therapy but she said he would call last month with apt and hasn't--encouraged pt to call peds back with apt IOL paperwork completed--pt desires 01/18/22   2. Obesity affecting pregnancy,  antepartum 3 lb 3.2 oz (1.452 kg) 3 lb wt gain in past 2 wks Not taking ASA daily  3. Advanced maternal age in multigravida, first trimester 41 yo   4. [place] multiparity G6P5 G6P5   Term labor symptoms and general obstetric precautions including but not limited to vaginal bleeding, contractions, leaking of fluid and fetal movement were reviewed in detail with the patient. Please refer to After Visit Summary for other counseling recommendations.  Return in about 1 week (around 01/08/2022) for routine PNC.  No future appointments.  Herbie Saxon, CNM

## 2022-01-01 NOTE — Progress Notes (Signed)
[place] referral faxed with snapshot pages / overview box and [place] report. Fax confirmation received.  Rich Number, RN

## 2022-01-06 ENCOUNTER — Other Ambulatory Visit: Payer: Self-pay

## 2022-01-06 ENCOUNTER — Encounter: Payer: Self-pay | Admitting: Obstetrics and Gynecology

## 2022-01-06 ENCOUNTER — Inpatient Hospital Stay
Admission: EM | Admit: 2022-01-06 | Discharge: 2022-01-07 | DRG: 807 | Disposition: A | Discharge status: Home / Self Care | Payer: Medicaid Other | Attending: Obstetrics | Admitting: Obstetrics

## 2022-01-06 DIAGNOSIS — O26893 Other specified pregnancy related conditions, third trimester: Secondary | ICD-10-CM | POA: Diagnosis present

## 2022-01-06 DIAGNOSIS — O99214 Obesity complicating childbirth: Secondary | ICD-10-CM | POA: Diagnosis present

## 2022-01-06 DIAGNOSIS — Z20822 Contact with and (suspected) exposure to covid-19: Secondary | ICD-10-CM | POA: Diagnosis present

## 2022-01-06 DIAGNOSIS — Z3A39 39 weeks gestation of pregnancy: Secondary | ICD-10-CM | POA: Diagnosis not present

## 2022-01-06 DIAGNOSIS — O479 False labor, unspecified: Secondary | ICD-10-CM | POA: Diagnosis present

## 2022-01-06 LAB — CBC WITH DIFFERENTIAL/PLATELET
Abs Immature Granulocytes: 0.14 K/uL — ABNORMAL HIGH (ref 0.00–0.07)
Basophils Absolute: 0.1 K/uL (ref 0.0–0.1)
Basophils Relative: 0 %
Eosinophils Absolute: 2.7 K/uL — ABNORMAL HIGH (ref 0.0–0.5)
Eosinophils Relative: 15 %
HCT: 32.3 % — ABNORMAL LOW (ref 36.0–46.0)
Hemoglobin: 10.9 g/dL — ABNORMAL LOW (ref 12.0–15.0)
Immature Granulocytes: 1 %
Lymphocytes Relative: 12 %
Lymphs Abs: 2.1 K/uL (ref 0.7–4.0)
MCH: 31.1 pg (ref 26.0–34.0)
MCHC: 33.7 g/dL (ref 30.0–36.0)
MCV: 92 fL (ref 80.0–100.0)
Monocytes Absolute: 0.9 K/uL (ref 0.1–1.0)
Monocytes Relative: 5 %
Neutro Abs: 12.5 K/uL — ABNORMAL HIGH (ref 1.7–7.7)
Neutrophils Relative %: 67 %
Platelets: 272 K/uL (ref 150–400)
RBC: 3.51 MIL/uL — ABNORMAL LOW (ref 3.87–5.11)
RDW: 13.7 % (ref 11.5–15.5)
Smear Review: NORMAL
WBC: 18.5 K/uL — ABNORMAL HIGH (ref 4.0–10.5)
nRBC: 0 % (ref 0.0–0.2)

## 2022-01-06 LAB — CBC
HCT: 39.1 % (ref 36.0–46.0)
Hemoglobin: 13.2 g/dL (ref 12.0–15.0)
MCH: 30.8 pg (ref 26.0–34.0)
MCHC: 33.8 g/dL (ref 30.0–36.0)
MCV: 91.1 fL (ref 80.0–100.0)
Platelets: 304 10*3/uL (ref 150–400)
RBC: 4.29 MIL/uL (ref 3.87–5.11)
RDW: 13.7 % (ref 11.5–15.5)
WBC: 9.5 10*3/uL (ref 4.0–10.5)
nRBC: 0 % (ref 0.0–0.2)

## 2022-01-06 LAB — RPR: RPR Ser Ql: NONREACTIVE

## 2022-01-06 LAB — RESP PANEL BY RT-PCR (FLU A&B, COVID) ARPGX2
Influenza A by PCR: NEGATIVE
Influenza B by PCR: NEGATIVE
SARS Coronavirus 2 by RT PCR: NEGATIVE

## 2022-01-06 LAB — TYPE AND SCREEN
ABO/RH(D): A POS
Antibody Screen: NEGATIVE

## 2022-01-06 MED ORDER — LACTATED RINGERS IV SOLN: INTRAVENOUS | Status: DC

## 2022-01-06 MED ORDER — SOD CITRATE-CITRIC ACID 500-334 MG/5ML PO SOLN: 30.0000 mL | ORAL | Status: DC | PRN

## 2022-01-06 MED ORDER — ZOLPIDEM TARTRATE 5 MG PO TABS: 5.0000 mg | ORAL_TABLET | Freq: Every evening | ORAL | Status: DC | PRN

## 2022-01-06 MED ORDER — WITCH HAZEL-GLYCERIN EX PADS: 1.0000 "application " | MEDICATED_PAD | CUTANEOUS | Status: DC | PRN

## 2022-01-06 MED ORDER — ACETAMINOPHEN 325 MG PO TABS: 650.0000 mg | ORAL_TABLET | ORAL | Status: DC | PRN

## 2022-01-06 MED ORDER — OXYCODONE HCL 5 MG PO TABS: 5.0000 mg | ORAL_TABLET | ORAL | Status: DC | PRN

## 2022-01-06 MED ORDER — COCONUT OIL OIL
1.0000 "application " | TOPICAL_OIL | Status: DC | PRN
  Filled 2022-01-06: qty 120

## 2022-01-06 MED ORDER — DIBUCAINE (PERIANAL) 1 % EX OINT: 1.0000 "application " | TOPICAL_OINTMENT | CUTANEOUS | Status: DC | PRN

## 2022-01-06 MED ORDER — ONDANSETRON HCL 4 MG/2ML IJ SOLN: 4.0000 mg | Freq: Four times a day (QID) | INTRAMUSCULAR | Status: DC | PRN

## 2022-01-06 MED ORDER — LACTATED RINGERS IV SOLN
500.0000 mL | INTRAVENOUS | Status: DC | PRN
  Administered 2022-01-06: 500 mL via INTRAVENOUS

## 2022-01-06 MED ORDER — IBUPROFEN 600 MG PO TABS
600.0000 mg | ORAL_TABLET | Freq: Four times a day (QID) | ORAL | Status: DC
  Administered 2022-01-06 – 2022-01-07 (×5): 600 mg via ORAL
  Filled 2022-01-06 (×5): qty 1

## 2022-01-06 MED ORDER — CARBOPROST TROMETHAMINE 250 MCG/ML IM SOLN
INTRAMUSCULAR | Status: AC
  Filled 2022-01-06: qty 1

## 2022-01-06 MED ORDER — ONDANSETRON HCL 4 MG PO TABS: 4.0000 mg | ORAL_TABLET | ORAL | Status: DC | PRN

## 2022-01-06 MED ORDER — ONDANSETRON HCL 4 MG/2ML IJ SOLN: 4.0000 mg | INTRAMUSCULAR | Status: DC | PRN

## 2022-01-06 MED ORDER — LIDOCAINE HCL (PF) 1 % IJ SOLN
INTRAMUSCULAR | Status: AC
  Filled 2022-01-06: qty 30

## 2022-01-06 MED ORDER — OXYCODONE-ACETAMINOPHEN 5-325 MG PO TABS: 2.0000 | ORAL_TABLET | ORAL | Status: DC | PRN

## 2022-01-06 MED ORDER — FERROUS SULFATE 325 (65 FE) MG PO TABS
325.0000 mg | ORAL_TABLET | Freq: Two times a day (BID) | ORAL | Status: DC
  Administered 2022-01-06 – 2022-01-07 (×2): 325 mg via ORAL
  Filled 2022-01-06 (×2): qty 1

## 2022-01-06 MED ORDER — OXYTOCIN BOLUS FROM INFUSION
333.0000 mL | Freq: Once | INTRAVENOUS | Status: AC
  Administered 2022-01-06: 333 mL via INTRAVENOUS

## 2022-01-06 MED ORDER — DOCUSATE SODIUM 100 MG PO CAPS
100.0000 mg | ORAL_CAPSULE | Freq: Two times a day (BID) | ORAL | Status: DC
  Administered 2022-01-07: 100 mg via ORAL
  Filled 2022-01-06: qty 1

## 2022-01-06 MED ORDER — LIDOCAINE HCL (PF) 1 % IJ SOLN: 30.0000 mL | INTRAMUSCULAR | Status: DC | PRN

## 2022-01-06 MED ORDER — AMMONIA AROMATIC IN INHA
RESPIRATORY_TRACT | Status: AC
  Filled 2022-01-06: qty 10

## 2022-01-06 MED ORDER — OXYTOCIN 10 UNIT/ML IJ SOLN
INTRAMUSCULAR | Status: AC
  Filled 2022-01-06: qty 2

## 2022-01-06 MED ORDER — OXYTOCIN-SODIUM CHLORIDE 30-0.9 UT/500ML-% IV SOLN
2.5000 [IU]/h | INTRAVENOUS | Status: DC
  Administered 2022-01-06: 2.5 [IU]/h via INTRAVENOUS
  Filled 2022-01-06: qty 500

## 2022-01-06 MED ORDER — TETANUS-DIPHTH-ACELL PERTUSSIS 5-2.5-18.5 LF-MCG/0.5 IM SUSY
0.5000 mL | PREFILLED_SYRINGE | Freq: Once | INTRAMUSCULAR | Status: DC
  Filled 2022-01-06: qty 0.5

## 2022-01-06 MED ORDER — METHYLERGONOVINE MALEATE 0.2 MG/ML IJ SOLN
INTRAMUSCULAR | Status: AC
  Filled 2022-01-06: qty 1

## 2022-01-06 MED ORDER — DIPHENHYDRAMINE HCL 25 MG PO CAPS: 25.0000 mg | ORAL_CAPSULE | Freq: Four times a day (QID) | ORAL | Status: DC | PRN

## 2022-01-06 MED ORDER — MISOPROSTOL 200 MCG PO TABS
ORAL_TABLET | ORAL | Status: AC
  Filled 2022-01-06: qty 4

## 2022-01-06 MED ORDER — OXYCODONE HCL 5 MG PO TABS: 10.0000 mg | ORAL_TABLET | ORAL | Status: DC | PRN

## 2022-01-06 MED ORDER — TRANEXAMIC ACID-NACL 1000-0.7 MG/100ML-% IV SOLN
INTRAVENOUS | Status: AC
  Filled 2022-01-06: qty 100

## 2022-01-06 MED ORDER — OXYCODONE-ACETAMINOPHEN 5-325 MG PO TABS: 1.0000 | ORAL_TABLET | ORAL | Status: DC | PRN

## 2022-01-06 MED ORDER — SIMETHICONE 80 MG PO CHEW: 80.0000 mg | CHEWABLE_TABLET | ORAL | Status: DC | PRN

## 2022-01-06 MED ORDER — ACETAMINOPHEN 325 MG PO TABS
650.0000 mg | ORAL_TABLET | ORAL | Status: DC | PRN
  Administered 2022-01-06: 650 mg via ORAL
  Filled 2022-01-06: qty 2

## 2022-01-06 MED ORDER — BENZOCAINE-MENTHOL 20-0.5 % EX AERO: 1.0000 "application " | INHALATION_SPRAY | CUTANEOUS | Status: DC | PRN

## 2022-01-06 MED ORDER — PRENATAL MULTIVITAMIN CH
1.0000 | ORAL_TABLET | Freq: Every day | ORAL | Status: DC
  Administered 2022-01-06 – 2022-01-07 (×2): 1 via ORAL
  Filled 2022-01-06 (×2): qty 1

## 2022-01-06 NOTE — H&P (Signed)
OB History & Physical   History of Present Illness:  Chief Complaint:   HPI:  Kimberly Cherry is a 41 y.o. G50P5005 female at [redacted]w[redacted]d dated by 19 week u/s.  She presents to L&D for uterine contractions  She reports:  -active fetal movement -LOF/SROM at 0324 -no vaginal bleeding -contractions currently every 2 minutes  Pregnancy Issues: 1. AMA 2. [place] multiparity 3. Obesity 4. H/o LEEP   Maternal Medical History:   Past Medical History:  Diagnosis Date   History of postpartum hemorrhage 2020   Vaginal Pap smear, abnormal     Past Surgical History:  Procedure Laterality Date   CERVICAL BIOPSY     LEEP  2009    No Known Allergies  Prior to Admission medications   Medication Sig Start Date End Date Taking? Authorizing Provider  Prenatal Vit-Fe Fumarate-FA (PRENATAL VITAMIN) 27-0.8 MG TABS Take 1 tablet by mouth daily. 12/06/21   Caren Macadam, MD     Prenatal care site: [facility]   Social History: She  reports that she has never smoked. She has never been exposed to tobacco smoke. She has never used smokeless tobacco. She reports that she does not drink alcohol and does not use drugs.  Family History: family history includes Anemia in her sister; Asthma in her brother; Diabetes in her sister; Healthy in her father and mother.   Review of Systems: A full review of systems was performed and negative except as noted in the HPI.    Physical Exam:  Vital Signs: LMP 04/15/2021 (Approximate) Comment: normal  General:   alert and cooperative  Skin:  normal  Neurologic:    Alert & oriented x 3  Lungs:    Nl effort  Heart:   regular rate and rhythm  Abdomen:   Gravid, Soft, non-tender between UCs  Extremities: : non-tender, symmetric, no edema bilaterally.       Pertinent Results:  Prenatal Labs: Blood type/Rh A pos  Antibody screen neg  Rubella Immune  Varicella Immune  RPR NR  HBsAg Neg  HIV NR  GC neg  Chlamydia neg   Genetic screening   1 hour GTT 129  3 hour GTT   GBS neg   FHT: FHR: 140 bpm, variability: moderate,  accelerations:  Present,  decelerations:  Present [place] Category/reactivity:  Category I TOCO: regular, every 1-2 minutes SVE: Dilation: 7.5 / Effacement (%): 100 / Station: -2     Assessment:  Kimberly Cherry is a 41 y.o. G67P5005 female at [redacted]w[redacted]d with uterine contractions.   Plan:  1. Admit to Labor & Delivery; consents reviewed and obtained  2. Fetal Well being  - Fetal Tracing: Cat I - GBS neg - Presentation: vtx confirmed by sve   3. Routine OB: - Prenatal labs reviewed, as above - Rh pos - CBC & T&S on admit - Clear fluids, IVF  4. Monitoring of Labor -  Contractions by external toco in place -  Pelvis proven to 3827g -  Plan for continuous fetal monitoring  -  Maternal pain control as desired: IVPM, nitrous, regional anesthesia - Anticipate vaginal delivery  5. Post Partum Planning: - Infant feeding: Both - Contraception: OCPs - Tdap: given 10/18/21 - Flu: given 09/06/21  Linda Hedges, CNM 01/06/2022 3:26 AM

## 2022-01-06 NOTE — Discharge Summary (Signed)
Obstetrical Discharge Summary  Patient Name: Kimberly Cherry DOB: 1981/10/28 MRN: [id number]  Date of Admission: 01/06/2022 Date of Delivery: 01/06/22 Delivered by: Rochele Raring CNM Date of Discharge: 01/07/2022   Primary OB: [facility]  [id number] last menstrual period was 04/15/2021 (approximate). EDC Estimated Date of Delivery: 01/07/22 Gestational Age at Delivery: [redacted]w[redacted]d   Antepartum complications:  1. AMA 2. [place] multiparity 3. Obesity 4. H/o LEEP Admitting Diagnosis: Uterine Contractions and SROM Secondary Diagnosis: Patient Active Problem List   Diagnosis Date Noted   NSVD (normal spontaneous vaginal delivery) 01/06/2022   Supervision of high-risk pregnancy, unspecified trimester 07/12/2021   Obesity affecting pregnancy, antepartum 07/12/2021   [place] multiparity G6P5 07/12/2021   History of abnormal cervical Pap smear 06/21/2019   Advanced maternal age in multigravida, first trimester 40 yo     Augmentation: N/A Complications: None  Intrapartum complications/course:  Delivery Type: spontaneous vaginal delivery Anesthesia: none Placenta: spontaneous Laceration: skid marks Episiotomy: none Newborn Data: Live born female  Birth Weight:  (4160g) 9lb 2.7oz  APGAR: 9, 9  Newborn Delivery   Birth date/time: 01/06/2022 04:32:00 Delivery type: Vaginal, Spontaneous      41 y.o. [id number] at [redacted]w[redacted]d presenting with uterine contractions, SROM with clear fluid.  She progressed to complete and pushed over an intact perineum and delivered the fetal head, followed promptly by the shoulders. She was in control the whole time, and the baby placed on the maternal abdomen. Delayed cord clamping by 1 min and the FOB cut the baby's cord, while the baby was skin to skin. The placenta delivered spontaneously and intact. Small infraclitoral laceration that was hemostatic and did not require a stitch. Mom and baby tolerated the procedure well.   Postpartum Procedures: N/A  Post partum  course:  Patient had an uncomplicated postpartum course.  By time of discharge on PPD#1, her pain was controlled on oral pain medications; she had appropriate lochia and was ambulating, voiding without difficulty and tolerating regular diet.  She was deemed stable for discharge to home.    Discharge Physical Exam:  BP 120/80 (BP Location: Right Arm)    Pulse 96    Temp 98.4 F (36.9 C) (Oral)    Resp 18    Ht 5' (1.524 m)    Wt 76.3 kg    LMP 04/15/2021 (Approximate) Comment: normal   SpO2 100%    BMI 32.85 kg/m   General: alert and no distress Pulm: normal respiratory effort Lochia: appropriate Abdomen: soft, NT Uterine Fundus: firm, below umbilicus  healing well, no significant drainage, no dehiscence, no significant erythema Extremities: No evidence of DVT seen on physical exam. No lower extremity edema. [place] Postnatal Depression Scale Screening Tool 01/06/2022 01/20/2020 11/05/2019 11/04/2019  I have been able to laugh and see the funny side of things. 0 0 0 (No Data)  I have looked forward with enjoyment to things. 0 0 0 -  I have blamed myself unnecessarily when things went wrong. 0 0 0 -  I have been anxious or worried for no good reason. 0 0 0 -  I have felt scared or panicky for no good reason. 0 3 0 -  Things have been getting on top of me. 1 0 0 -  I have been so unhappy that I have had difficulty sleeping. 0 0 0 -  I have felt sad or miserable. 0 0 0 -  I have been so unhappy that I have been crying. 0 0 0 -  The thought of  harming myself has occurred to me. 0 0 0 -  [place] Postnatal Depression Scale Total 1 3 0 -     Labs: CBC Latest Ref Rng & Units 01/07/2022 01/06/2022 01/06/2022  WBC 4.0 - 10.5 K/uL 10.6(H) 18.5(H) 9.5  Hemoglobin 12.0 - 15.0 g/dL 9.6(L) 10.9(L) 13.2  Hematocrit 36.0 - 46.0 % 28.3(L) 32.3(L) 39.1  Platelets 150 - 400 K/uL 253 272 304   A POS Hemoglobin  Date Value Ref Range Status  01/07/2022 9.6 (L) 12.0 - 15.0 g/dL Final   07/12/2021 13.2 11.1 - 15.9 g/dL Final  05/06/2019 11.6  Final   HCT  Date Value Ref Range Status  01/07/2022 28.3 (L) 36.0 - 46.0 % Final   Hematocrit  Date Value Ref Range Status  07/12/2021 39.7 34.0 - 46.6 % Final    Disposition: stable, discharge to home Baby Feeding: breastmilk and formula Baby Disposition: home with mom  Contraception: OCPs  Prenatal Labs:  Blood type/Rh A pos  Antibody screen neg  Rubella Immune  Varicella Immune  RPR NR  HBsAg Neg  HIV NR  GC neg  Chlamydia neg  Genetic screening    1 hour GTT 129  3 hour GTT    GBS neg   Rh Immune globulin given: n/a Rubella vaccine given: Immune Varicella vaccine given: Immune Tdap vaccine given in AP or PP setting: 10/18/21 Flu vaccine given in AP or PP setting: 09/06/21  Plan: Kimberly Cherry was discharged to home in good condition.   Discharge Instructions: Per After Visit Summary. Activity: Advance as tolerated. Pelvic rest for 6 weeks.   Diet: Regular Discharge Medications: Allergies as of 01/07/2022   No Known Allergies      Medication List     TAKE these medications    acetaminophen 325 MG tablet Commonly known as: Tylenol Take 2 tablets (650 mg total) by mouth every 4 (four) hours as needed (for pain scale < 4).   benzocaine-Menthol 20-0.5 % Aero Commonly known as: DERMOPLAST Apply 1 application topically as needed for irritation (perineal discomfort).   coconut oil Oil Apply 1 application topically as needed.   dibucaine 1 % Oint Commonly known as: NUPERCAINAL Place 1 application rectally as needed for hemorrhoids.   docusate sodium 100 MG capsule Commonly known as: COLACE Take 1 capsule (100 mg total) by mouth 2 (two) times daily.   ferrous sulfate 325 (65 FE) MG tablet Take 1 tablet (325 mg total) by mouth 2 (two) times daily with a meal.   ibuprofen 600 MG tablet Commonly known as: ADVIL Take 1 tablet (600 mg total) by mouth every 6 (six) hours.    Prenatal Vitamin 27-0.8 MG Tabs Take 1 tablet by mouth daily.   simethicone 80 MG chewable tablet Commonly known as: MYLICON Chew 1 tablet (80 mg total) by mouth as needed for flatulence.   witch hazel-glycerin pad Commonly known as: TUCKS Apply 1 application topically as needed for hemorrhoids.       Outpatient follow up:   Follow-up [place] DEPT Follow up in 6 week(s).   Why: PP visit Contact information: [place] [id number] [phone]                Signed:  Avelino Leeds [facility] [id number] 10:03 AM

## 2022-01-06 NOTE — Lactation Note (Signed)
This note was copied from a baby's chart. Lactation Consultation Note  Patient Name: Kimberly Cherry [id number] Date: 01/06/2022 Reason for consult: Initial assessment Age:39 hours Lactation to the room for initial visit. Mother is eating on the edge of the bed, baby is asleep in bassinet.  Encouraged feeding on demand and with cues. If baby is not cueing encouraged hand expression and skin to skin. Reviewed proper technique for hand expression. Encouraged 8 or more attempts in the first 24 hours and 8 or more good feeds after 24 HOL. Reviewed appropriate diapers for days of life and How to know your baby is getting enough to eat. Reviewed "Understanding Postpartum and Newborn Care" booklet at bedside. [facility] # left on board, encouraged to call for any assistance. Mother has no further questions at this time. Will need a manual pump for discharge.   Maternal Data Has patient been taught Hand Expression?: Yes Does the patient have breastfeeding experience prior to this delivery?: Yes How long did the patient breastfeed?: 61mo-103mo (3 months with the 41 year old)  Feeding Mother's Current Feeding Choice: Breast Milk  Interventions Interventions: Breast feeding basics reviewed;Education  Discharge Discharge Education: Engorgement and breast care;Warning signs for feeding baby Pump:  (will need a manual for discharge, no pump at home)  Consult Status Consult Status: PRN  Kimberly Cherry 01/06/2022, 11:19 AM

## 2022-01-07 LAB — CBC
HCT: 28.3 % — ABNORMAL LOW (ref 36.0–46.0)
Hemoglobin: 9.6 g/dL — ABNORMAL LOW (ref 12.0–15.0)
MCH: 30.8 pg (ref 26.0–34.0)
MCHC: 33.9 g/dL (ref 30.0–36.0)
MCV: 90.7 fL (ref 80.0–100.0)
Platelets: 253 10*3/uL (ref 150–400)
RBC: 3.12 MIL/uL — ABNORMAL LOW (ref 3.87–5.11)
RDW: 14.3 % (ref 11.5–15.5)
WBC: 10.6 10*3/uL — ABNORMAL HIGH (ref 4.0–10.5)
nRBC: 0 % (ref 0.0–0.2)

## 2022-01-07 MED ORDER — IBUPROFEN 600 MG PO TABS
600.0000 mg | ORAL_TABLET | Freq: Four times a day (QID) | ORAL | 0 refills | Status: AC
Start: 2022-01-07 — End: ?

## 2022-01-07 MED ORDER — SIMETHICONE 80 MG PO CHEW: 80.0000 mg | CHEWABLE_TABLET | ORAL | 0 refills | Status: DC | PRN

## 2022-01-07 MED ORDER — DIBUCAINE (PERIANAL) 1 % EX OINT
1.0000 | TOPICAL_OINTMENT | CUTANEOUS | Status: DC | PRN
Start: 2022-01-07 — End: 2023-03-02

## 2022-01-07 MED ORDER — BENZOCAINE-MENTHOL 20-0.5 % EX AERO: 1.0000 "application " | INHALATION_SPRAY | CUTANEOUS | Status: DC | PRN

## 2022-01-07 MED ORDER — COCONUT OIL OIL: 1.0000 "application " | TOPICAL_OIL | 0 refills | Status: DC | PRN

## 2022-01-07 MED ORDER — ACETAMINOPHEN 325 MG PO TABS: 650.0000 mg | ORAL_TABLET | ORAL | Status: DC | PRN

## 2022-01-07 MED ORDER — WITCH HAZEL-GLYCERIN EX PADS: 1.0000 "application " | MEDICATED_PAD | CUTANEOUS | 12 refills | Status: DC | PRN

## 2022-01-07 MED ORDER — DOCUSATE SODIUM 100 MG PO CAPS: 100.0000 mg | ORAL_CAPSULE | Freq: Two times a day (BID) | ORAL | 0 refills | Status: DC

## 2022-01-07 MED ORDER — FERROUS SULFATE 325 (65 FE) MG PO TABS: 325.0000 mg | ORAL_TABLET | Freq: Two times a day (BID) | ORAL | 3 refills | Status: DC

## 2022-01-07 NOTE — Lactation Note (Signed)
This note was copied from a baby's chart. Lactation Consultation Note  Patient Name: Kimberly Cherry [id number] Date: 01/07/2022 Reason for consult: Follow-up assessment;Term Age:39 hours  Lactation follow-up visit. Use of interpreter services [phone] for visit. Mom is breast and bottle feeding, reporting BF history but challenges of low milk supply. The plan is for discharge today. No concerns voice.  LC at bedside to provide manual pump for occasional pumping/extra stimulation as needed. LC reviewed assembly of parts/pieces, use of pump, cleaning, and milk storage/thawing. Encouraged continued BF efforts, review of milk supply and demand, encouraging to offer breast before bottle to increase supply. Parents had no questions at this time; encouraged to call as needed.  Maternal Data Has patient been taught Hand Expression?: Yes Does the patient have breastfeeding experience prior to this delivery?: Yes  Feeding Mother's Current Feeding Choice: Breast Milk and Formula Nipple Type: Slow - flow  LATCH Score                    Lactation Tools Discussed/Used Tools: Pump Breast pump type: Manual Pump Education: Setup, frequency, and cleaning;Milk Storage Reason for Pumping: for occasional pumping/discharge Pumping frequency: as needed  Interventions Interventions: Hand pump;Education  Discharge Discharge Education: Engorgement and breast care;Outpatient recommendation Pump: Manual  Consult Status Consult Status: Complete    Lavonia Drafts 01/07/2022, 9:09 AM

## 2022-01-08 ENCOUNTER — Ambulatory Visit: Payer: Self-pay

## 2022-02-19 ENCOUNTER — Ambulatory Visit: Payer: Self-pay | Admitting: Family Medicine

## 2022-02-19 ENCOUNTER — Other Ambulatory Visit: Payer: Self-pay

## 2022-02-19 ENCOUNTER — Encounter: Payer: Self-pay | Admitting: Family Medicine

## 2022-02-19 DIAGNOSIS — Z113 Encounter for screening for infections with a predominantly sexual mode of transmission: Secondary | ICD-10-CM

## 2022-02-19 DIAGNOSIS — Z30011 Encounter for initial prescription of contraceptive pills: Secondary | ICD-10-CM

## 2022-02-19 LAB — HEMOGLOBIN, FINGERSTICK: Hemoglobin: 12.7 g/dL (ref 11.1–15.9)

## 2022-02-19 LAB — WET PREP FOR TRICH, YEAST, CLUE
Trichomonas Exam: NEGATIVE
Yeast Exam: NEGATIVE

## 2022-02-19 LAB — HM HIV SCREENING LAB: HM HIV Screening: NEGATIVE

## 2022-02-19 MED ORDER — NORETHINDRONE 0.35 MG PO TABS: 1.0000 | ORAL_TABLET | Freq: Every day | ORAL | 12 refills | Status: DC

## 2022-02-19 NOTE — Progress Notes (Signed)
9 packs Norethindrone dispensed due to expiration date. Jossie Ng, RN ? ?

## 2022-02-19 NOTE — Progress Notes (Signed)
[facility] Department ? ?Post Partum Exam ? ?Kimberly Cherry is a 41 y.o. [id number] female who presents for a postpartum visit. She is 6 week postpartum following a spontaneous vaginal delivery. I have fully reviewed the prenatal and intrapartum course. The delivery was at 39 gestational weeks.  Anesthesia: none. Postpartum course has been going well. Baby's course has been doing well, adjusting well. Baby is feeding by Bottle and Breast Bleeding no bleeding. Bowel function is normal. Bladder function is normal. Patient is sexually active. Desired contraception method is Female Condom ? ? ?Postpartum depression screening: ? Edinburgh Postnatal Depression Scale - 02/19/22 1030   ? ?  ? Edinburgh Postnatal Depression Scale:  In the Past 7 Days  ? I have been able to laugh and see the funny side of things. 0   ? I have looked forward with enjoyment to things. 0   ? I have blamed myself unnecessarily when things went wrong. 0   ? I have been anxious or worried for no good reason. 0   ? I have felt scared or panicky for no good reason. 3   ? Things have been getting on top of me. 2   ? I have been so unhappy that I have had difficulty sleeping. 2   ? I have felt sad or miserable. 0   ? I have been so unhappy that I have been crying. 0   ? The thought of harming myself has occurred to me. 0   ? Edinburgh Postnatal Depression Scale Total 7   ? ?  ?  ? ?  ? ? ? ?The following portions of the patient's history were reviewed and updated as appropriate: allergies, current medications, past family history, past medical history, past social history, past surgical history, and problem list. ?Last pap smear done 01/20/2020 and was Normal ? ?Review of Systems ?Pertinent items are noted in HPI.   ? ?Objective:  ?BP 113/81   Pulse 87   Temp 98.3 ?F (36.8 ?C)   Wt 148 lb 9.6 oz (67.4 kg)   Breastfeeding Yes Comment: Infant also fed formula.  BMI 29.02 kg/m?  ? ?Gen: well appearing, NAD ?HEENT: no scleral  icterus ?CV: RR ?Lung: Normal WOB ?Breast:performed-yes  ?Ext: warm well perfused ? ?GU: yes  ?Rectal: performed -  not indicated ?      ?Assessment:  ? ?  postpartum exam. Pap smear not done at today's visit.  ? ?Plan:  ? ?Essential components of care per ACOG recommendations for Comprehensive Postpartum exam: ? ?1.  Mood and well being: Patient with negative depression screening today. Reviewed local resources for support. EPDS is low risk. Reviewed resources and that mood sx in first year after pregnancy are considered related to pregnancy and to reach out for help at [facility] if needed. Discussed [facility] as link to care and availability of LCSW for counseling  ?- Patient does not use tobacco.  ?- hx of drug use? No   ? ?2. Infant care and feeding:  ?-Patient currently breastmilk feeding? Yes  Reviewed importance of draining breast regularly to support lactation. I ? -Recommended patient engage with WIC/BFpeer counselors ? -Counseled to sign new child up for [facility] services ?-Social determinants of health (SDOH) reviewed in EPIC. No concerns. No areas of concern.  ? ?3. Sexuality, contraception and birth spacing ? ?Contraception: Contraception counseling: Reviewed all forms of birth control options in the tiered based approach. available including abstinence; over the counter/barrier methods; hormonal contraceptive medication including  pill, patch, ring, injection,contraceptive implant; hormonal and nonhormonal IUDs; permanent sterilization options including vasectomy and the various tubal sterilization modalities. Risks, benefits, and typical effectiveness rates were reviewed.  Questions were answered.  Written information was also given to the patient to review.  Patient desires OCP, this was prescribed for patient. She will follow up in  as needed  for surveillance.  She was told to call with any further questions, or with any concerns about this method of contraception.  Emphasized use of condoms 100% of the time for  STI prevention. ? ?Patient was not offered ECP d/t last sex.  ? ?- Patient does not want a pregnancy in the next year.  Desired family size is 6 children.  ?- Reviewed forms of contraception in tiered fashion. Patient desired oral progesterone-only contraceptive today.   ?- Discussed birth spacing of 18 months ? ?4. Sleep and fatigue ?-Encouraged family/partner/community support of 4 hrs of uninterrupted sleep to help with mood and fatigue ? ?5. Physical Recovery  ?- Discussed patients delivery and complications ?- Patient had a 0 degree laceration, perineal healing reviewed. Patient expressed understanding ?- Patient has urinary incontinence? No Patient was referred to pelvic floor PT  ?- Patient is safe to resume physical and sexual activity ? ?6.  Health Maintenance/Chronic Disease ?Health Maintenance Due  ?Topic Date Due  ? COVID-19 Vaccine (1) Never done  ? INFLUENZA VACCINE  07/09/2021  ? ? ?- Last pap smear performed2/10/2020 and was normal with negative HPV. ?Mammogram ?1. Postpartum exam ?Hbg at delivery was  9.6, drawn today  ?- Hemoglobin, venipuncture ? ?2. Screening examination for venereal disease ?Desires screening, denies s/sx  ?- Chlamydia/Gonorrhea [place] [facility] ?- HIV [place] [facility] ?- Syphilis Serology, [place] [facility] ?- WET PREP FOR TRICH, YEAST, CLUE ? ?3. Encounter for initial prescription of contraceptive pills ?Consent signed  ?- norethindrone (MICRONOR) 0.35 MG tablet; Take 1 tablet (0.35 mg total) by mouth daily.  Dispense: 28 tablet; Refill: 12 ? ? ? ?Patient given handout about PCP care in the community ?Given MVI per family planning program guidelines and availability ? ?[facility] interpreter  used for Spanish interpretation.     ? ?Follow up in: 1  year  or as needed.  ? ?Wendi Snipes, FNP ? ?

## 2022-11-19 ENCOUNTER — Ambulatory Visit: Payer: Self-pay

## 2022-11-19 ENCOUNTER — Ambulatory Visit (LOCAL_COMMUNITY_HEALTH_CENTER): Payer: Self-pay

## 2022-11-19 VITALS — BP 133/81 | Wt 163.5 lb

## 2022-11-19 DIAGNOSIS — Z3009 Encounter for other general counseling and advice on contraception: Secondary | ICD-10-CM

## 2022-11-19 DIAGNOSIS — Z3041 Encounter for surveillance of contraceptive pills: Secondary | ICD-10-CM

## 2022-11-19 MED ORDER — NORETHINDRONE 0.35 MG PO TABS: 1.0000 | ORAL_TABLET | Freq: Every day | ORAL | 0 refills | Status: DC

## 2022-11-19 NOTE — Progress Notes (Signed)
Client see in Nurse Clinic for  [facility] refill Micronor.  Interpretation provided by M. Yemen.  Client reports taking pill daily at same time, denies missing any pills.Client currently has  18 pills in this last pack.    Micronor #9 packs have been dispensed since order by Elveria Rising on 02/19/2022. Due for #4 packs today.    The patient was dispensed Micronor (Norethindrone, #4 packs, as ordered by Elveria Rising on 02/19/2022 today. I provided counseling today regarding the medication. We discussed the medication, the side effects and when to call clinic. Patient given the opportunity to ask questions. Questions answered.    Client advised to call to make appointment exam in March.  Due for Exam 02/19/2022.    Client reports n questions.

## 2023-02-26 ENCOUNTER — Ambulatory Visit (LOCAL_COMMUNITY_HEALTH_CENTER): Payer: Self-pay | Admitting: Family Medicine

## 2023-02-26 VITALS — BP 132/82 | HR 62 | Ht 59.0 in | Wt 163.0 lb

## 2023-02-26 DIAGNOSIS — Z3009 Encounter for other general counseling and advice on contraception: Secondary | ICD-10-CM

## 2023-02-26 DIAGNOSIS — Z01419 Encounter for gynecological examination (general) (routine) without abnormal findings: Secondary | ICD-10-CM

## 2023-02-26 DIAGNOSIS — Z113 Encounter for screening for infections with a predominantly sexual mode of transmission: Secondary | ICD-10-CM

## 2023-02-26 LAB — WET PREP FOR TRICH, YEAST, CLUE
Trichomonas Exam: NEGATIVE
Yeast Exam: NEGATIVE

## 2023-02-26 LAB — HM HIV SCREENING LAB: HM HIV Screening: NEGATIVE

## 2023-02-26 MED ORDER — NORETHINDRONE 0.35 MG PO TABS: 1.0000 | ORAL_TABLET | Freq: Every day | ORAL | 12 refills | Status: DC

## 2023-02-26 NOTE — Progress Notes (Signed)
[facility] Number: [phone]  Family Planning Visit- Repeat Yearly Visit  Subjective:  Kimberly Cherry is a 42 y.o. [phone]  being seen today for an annual wellness visit and to discuss contraception options.   The patient is currently using Oral Contraceptive for pregnancy prevention. Patient does not want a pregnancy in the next year.    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  Chief Complaint  Patient presents with   Contraception    PE and wants birth control that's either permanent or long term    Patient reports to clinic for PE and [facility]  Patient denies concerns about self today   See flowsheet for other program required questions.   Body mass index is 32.92 kg/m. - Patient is eligible for diabetes screening based on BMI> 25 and age >35?  yes HA1C ordered? yes  Patient reports 1 of partners in last year. Desires STI screening?  Yes   Has patient been screened once for HCV in the past?  No  No results found for: "HCVAB"  Does the patient have current of drug use, have a partner with drug use, and/or has been incarcerated since last result? No  If yes-- Screen for HCV through [facility]   Does the patient meet criteria for HBV testing? No  Criteria:  -Household, sexual or needle sharing contact with HBV -History of drug use -HIV positive -Those with known Hep C   Health Maintenance Due  Topic Date Due   COVID-19 Vaccine (1) Never done   INFLUENZA VACCINE  07/09/2022    Review of Systems  Constitutional:  Negative for weight loss.  Eyes:  Negative for blurred vision.  Respiratory:  Negative for cough and shortness of breath.   Cardiovascular:  Negative for claudication.  Gastrointestinal:  Negative  for nausea.  Genitourinary:  Negative for dysuria and frequency.  Skin:  Negative for rash.  Neurological:  Negative for headaches.  Endo/Heme/Allergies:  Does not bruise/bleed easily.    The following portions of the patient's history were reviewed and updated as appropriate: allergies, current medications, past family history, past medical history, past social history, past surgical history and problem list. Problem list updated.  Objective:   Vitals:   02/26/23 1053  BP: 132/82  Pulse: 62  Weight: 163 lb (73.9 kg)  Height: 4\' 11"  (1.499 m)    Physical Exam Vitals and nursing note reviewed.  Constitutional:      Appearance: Normal appearance.  HENT:     Head: Normocephalic and atraumatic.     Mouth/Throat:     Mouth: Mucous membranes are moist.     Pharynx: Oropharynx is clear. No oropharyngeal exudate or posterior oropharyngeal erythema.  Cardiovascular:     Rate and Rhythm: Normal rate and regular rhythm.  Pulmonary:     Effort: Pulmonary effort is normal.  Abdominal:     General: Abdomen is flat.     Palpations: There is no mass.     Tenderness: There is no abdominal tenderness. There is no rebound.  Genitourinary:    General: Normal vulva.     Exam position: Lithotomy position.     Pubic Area: No rash or pubic lice.      Labia:  Right: No rash or lesion.        Left: No rash or lesion.      Vagina: Normal. No vaginal discharge, erythema, bleeding or lesions.     Cervix: No cervical motion tenderness, discharge, friability, lesion or erythema.     Uterus: Normal.      Adnexa: Right adnexa normal and left adnexa normal.     Rectum: Normal.     Comments: pH = 4   Lymphadenopathy:     Head:     Right side of head: No preauricular or posterior auricular adenopathy.     Left side of head: No preauricular or posterior auricular adenopathy.     Cervical: No cervical adenopathy.     Upper Body:     Right upper body: No supraclavicular, axillary or  epitrochlear adenopathy.     Left upper body: No supraclavicular, axillary or epitrochlear adenopathy.     Lower Body: No right inguinal adenopathy. No left inguinal adenopathy.  Skin:    General: Skin is warm and dry.     Findings: No rash.  Neurological:     Mental Status: She is alert and oriented to person, place, and time.    Assessment and Plan:  Kimberly Cherry is a 42 y.o. female [phone] presenting to the [facility] Department for an yearly wellness and contraception visit  Contraception counseling: Reviewed options based on patient desire and reproductive life plan. Patient is interested in Oral Contraceptive. This was provided to the patient today.   Risks, benefits, and typical effectiveness rates were reviewed.  Questions were answered.  Written information was also given to the patient to review.    The patient will follow up in  1 years for surveillance.  The patient was told to call with any further questions, or with any concerns about this method of contraception.  Emphasized use of condoms 100% of the time for STI prevention.  Patient was assessed for need for ECP. Not indicated.   1. Well woman exam with routine gynecological exam -CBE due in 2026 -has not had dental care since 2017, [facility] -ordered A1c test today  2. Screening for venereal disease - WET PREP FOR [place], YEAST, CLUE - Chlamydia/Gonorrhea [place] [facility] - HIV [place] [facility] - Syphilis Serology, [place] [facility]  3. Family planning Patient initially wanted BTL, but then thought about using Nexplanon or IUD until she can get a tubal. I reviewed that while both of these are good BCM, that they are not appropriate bridge methods to get her to the BTL. I offered her depo as well as other methods, and patient decided that she wants to stick to micronor. She declined a referral today to GYN for BTL. Encouraged her to RTC if she changed her mind about which  [facility] she wants.    Return if symptoms worsen or fail to improve.  Due to language barrier, interpreter [employer] was present for this visit.   Sharlet Salina, [place]

## 2023-02-26 NOTE — Progress Notes (Signed)
Pt appointment for PE, STI screening, and more OCP. Pt considering more long term options for birth control. Seen by FNP Lowella Petties. Family planning packet given and contents reviewed. Initial lab results all negative and reviewed with pt. Six out of 12 months of prescription dispensed, since pt is still considering switching to another longer-term option.

## 2023-02-27 LAB — HGB A1C W/O EAG: Hgb A1c MFr Bld: 6.1 % — ABNORMAL HIGH (ref 4.8–5.6)

## 2023-03-02 ENCOUNTER — Emergency Department
Admission: EM | Admit: 2023-03-02 | Discharge: 2023-03-02 | Disposition: A | Discharge status: Home / Self Care | Payer: Self-pay | Attending: Emergency Medicine | Admitting: Emergency Medicine

## 2023-03-02 ENCOUNTER — Other Ambulatory Visit: Payer: Self-pay

## 2023-03-02 DIAGNOSIS — S00411A Abrasion of right ear, initial encounter: Secondary | ICD-10-CM | POA: Insufficient documentation

## 2023-03-02 DIAGNOSIS — W268XXA Contact with other sharp object(s), not elsewhere classified, initial encounter: Secondary | ICD-10-CM | POA: Insufficient documentation
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
## 2023-03-02 NOTE — ED Notes (Signed)
Pt does not have damage to eardrum. Provider explained findings to pt. Pt has unlabored respirations and no pain. Pt will discharge.

## 2023-03-02 NOTE — Discharge Instructions (Addendum)
Your exam reveals an scratch to your ear canal with some bleeding.  No evidence of any damage to your eardrum.  This scratch should heal without intervention.  You should avoid any excess water in the ear and avoid any sharp objects in the ear canal.  El examen revela un rasguo en el canal auditivo con algo de sangrado.  No hay evidencia de ningn dao en el tmpano.  Este rasguo debera curarse sin intervencin.  Debe evitar cualquier exceso de agua en el odo y evitar cualquier objeto afilado en el canal [place].

## 2023-03-04 ENCOUNTER — Telehealth: Payer: Self-pay

## 2023-03-04 NOTE — Telephone Encounter (Signed)
-----   Message from [place], [place] sent at 02/28/2023  8:45 AM EDT ----- Prediabetes. Please contact patient with results and encourage seeing a PCP.

## 2023-03-05 ENCOUNTER — Telehealth: Payer: Self-pay

## 2023-03-05 ENCOUNTER — Telehealth: Payer: Self-pay | Admitting: Family Medicine

## 2023-03-05 NOTE — Telephone Encounter (Signed)
Pt was returning the call from [place] regarding her lab results. Please call w/interpreter to her cell number. Thanks

## 2023-03-05 NOTE — Telephone Encounter (Signed)
Phone call made to pt with an interpreter. Explained to pt that her recent test result for her Hemoglobin A1C, which checks her blood sugar over a period of time, puts her in the range of having Pre-Diabetes. Encouraged pt to see a PCP to help her manage this condition. In the meantime encouraged diet and exercise to help reduce her risk of developing Diabetes. All questions answered.

## 2023-07-16 ENCOUNTER — Ambulatory Visit (LOCAL_COMMUNITY_HEALTH_CENTER): Payer: Self-pay

## 2023-07-16 VITALS — BP 125/79 | Ht 59.0 in | Wt 159.0 lb

## 2023-07-16 DIAGNOSIS — Z3041 Encounter for surveillance of contraceptive pills: Secondary | ICD-10-CM

## 2023-07-16 DIAGNOSIS — Z3009 Encounter for other general counseling and advice on contraception: Secondary | ICD-10-CM

## 2023-07-16 MED ORDER — NORETHINDRONE 0.35 MG PO TABS: 1.0000 | ORAL_TABLET | Freq: Every day | ORAL | Status: DC

## 2023-07-16 NOTE — Progress Notes (Signed)
In nurse clinic for ocp refill (Micronor). Taking ocp as prescribed and is on last pack. Takes same time daily. Voices no concerns.   02/26/2023 - Dispensed #6 packs Micronor Due for #6 packs Micronor today.   The patient was dispensed Heather (Norethindrone) #6 packs today per order by Aliene Altes, FNP dated 02/26/2023. . I provided counseling today regarding the medication. We discussed the medication, the side effects and when to call clinic.  Advised to contact [facility] for appt when starts last pack of ocp. Label on ocp pack with this message. Patient given the opportunity to ask questions. Questions answered.   M Yemen, interpreter. Jerel Shepherd, RN

## 2023-12-23 ENCOUNTER — Ambulatory Visit: Payer: Self-pay

## 2023-12-23 VITALS — BP 137/80 | Ht 59.0 in | Wt 167.0 lb

## 2023-12-23 DIAGNOSIS — Z3009 Encounter for other general counseling and advice on contraception: Secondary | ICD-10-CM

## 2023-12-23 DIAGNOSIS — Z3041 Encounter for surveillance of contraceptive pills: Secondary | ICD-10-CM

## 2023-12-23 MED ORDER — NORETHINDRONE 0.35 MG PO TABS: 1.0000 | ORAL_TABLET | Freq: Every day | ORAL | Status: DC

## 2023-12-23 NOTE — Progress Notes (Signed)
 In nurse clinic for ocp refill (Micronor ). LILLETTE Roche, interpreter. Taking ocp as prescribed. Denies missing any pills.  Has 2 weeks left in current pack and #1 full pack remaining.   Due for #1 pack Micronor  today to complete order by VEAR Bers, FNP dated 02/26/2023.   02/26/2023 #6 packs Micronor  dispensed. 07/16/2023  #6 packs Micronor  dispensed.   The patient was dispensed Micronor  #1 pack today per order by VEAR Bers, FNP. I provided counseling today regarding the medication. We discussed the medication, the side effects and when to call clinic. Patient given the opportunity to ask questions. Questions answered.    Annual PE due 02/27/2024, has reminder. Seirra Kos, RN

## 2024-02-27 ENCOUNTER — Encounter: Payer: Self-pay | Admitting: Nurse Practitioner

## 2024-02-27 ENCOUNTER — Ambulatory Visit: Payer: Self-pay | Admitting: Nurse Practitioner

## 2024-02-27 VITALS — BP 150/95 | HR 66 | Wt 167.8 lb

## 2024-02-27 DIAGNOSIS — Z30011 Encounter for initial prescription of contraceptive pills: Secondary | ICD-10-CM

## 2024-02-27 DIAGNOSIS — Z3009 Encounter for other general counseling and advice on contraception: Secondary | ICD-10-CM

## 2024-02-27 DIAGNOSIS — Z113 Encounter for screening for infections with a predominantly sexual mode of transmission: Secondary | ICD-10-CM

## 2024-02-27 DIAGNOSIS — Z01419 Encounter for gynecological examination (general) (routine) without abnormal findings: Secondary | ICD-10-CM

## 2024-02-27 LAB — HM HIV SCREENING LAB: HM HIV Screening: NEGATIVE

## 2024-02-27 LAB — WET PREP FOR TRICH, YEAST, CLUE
Trichomonas Exam: NEGATIVE
Yeast Exam: NEGATIVE

## 2024-02-27 NOTE — Progress Notes (Signed)
 Pt is here for family planning visit.  Family planning packet reviewed and given to pt.  Wet prep results reviewed, no treatment required per standing orders. Condoms given. The patient was dispensed miconor #6 packs today. I provided counseling today regarding the medication. We discussed the medication, the side effects and when to call clinic. Patient given the opportunity to ask questions. Questions answered.

## 2024-02-28 LAB — HGB A1C W/O EAG: Hgb A1c MFr Bld: 6 % — ABNORMAL HIGH (ref 4.8–5.6)

## 2024-03-01 NOTE — Progress Notes (Signed)
 Would you please call patient and advised she is within the range considered pre-diabetic. She was also in this range last year. At this time it is imperative she seek care with a PCP for further evaluation and treatment as indicated. Thank you, Marylu Lund L. Analysa Nutting, FNP-C

## 2024-03-04 ENCOUNTER — Telehealth: Payer: Self-pay

## 2024-03-04 NOTE — Telephone Encounter (Signed)
-----   Message from Edmonia James sent at 03/01/2024 10:33 AM EDT ----- Would you please call patient and advised she is within the range considered pre-diabetic. She was also in this range last year. At this time it is imperative she seek care with a PCP for further evaluation and treatment as indicated. Thank you, Marylu Lund L. [place], FNP-C

## 2024-03-11 MED ORDER — NORETHINDRONE 0.35 MG PO TABS: 1.0000 | ORAL_TABLET | Freq: Every day | ORAL | 13 refills | Status: AC

## 2024-03-11 NOTE — Progress Notes (Signed)
 [employer] [facility] [address] [place] [address] Main phone: [phone]  Family Planning Visit - Repeat Yearly Visit  Subjective:  Rosalee Tolley is a 43 y.o. [phone]  being seen today for an annual wellness visit and to discuss contraception options. The patient is currently using oral contraceptive for pregnancy prevention. Patient does not want a pregnancy in the next year.   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 female who presents to the office today requesting PE, STI testing, and OCP refill. She reports no concerns today. She reports 1 female partner over the last 12 months, practices vaginal sex, uses condoms sometimes, and has no history of STI.   Review of Systems  Constitutional:  Negative for weight loss.  HENT:  Negative for sore throat.   Eyes:  Negative for blurred vision.  Respiratory:  Negative for cough, shortness of breath and wheezing.   Cardiovascular:  Negative for chest pain and claudication.  Gastrointestinal:  Negative for nausea and vomiting.  Genitourinary:  Negative for dysuria and frequency.  Skin:  Negative for rash.  Neurological:  Negative for dizziness, seizures and headaches.  Endo/Heme/Allergies:  Does not bruise/bleed easily.    See flowsheet for other program required questions.   Diabetes screening This patient is 43 y.o. with a BMI of Body mass index is 33.89 kg/m.Marland Kitchen  Is patient eligible for diabetes screening (age >35 and BMI >25)?  yes  Was Hgb A1c ordered?  yes  STI screening Patient reports 1 of partners in last year.  Does this patient desire STI screening?  Yes  Hepatitis C screening Has patient been screened once for HCV in the past?  No  No results found for: "HCVAB"  Does the patient meet criteria for HCV testing? No  (If yes-- Screen for HCV through [facility]) Criteria:  Since the last HCV result, does the patient have any of the following? - Current drug use - Have a partner with drug use - Has been incarcerated  Hepatitis B screening Does the patient meet criteria for HBV testing? No Criteria:  -Household, sexual or needle sharing contact with HBV -History of drug use -HIV positive -Those with known Hep C  Cervical Cancer Screening  Result Date Procedure Results Follow-ups  01/20/2020 IGP, Aptima HPV DIAGNOSIS:: Comment Specimen adequacy:: Comment Clinician Provided ICD10: Comment Performed by:: Comment QC reviewed by:: Comment PAP Smear Comment: . Note:: Comment Test Methodology: Comment HPV Aptima: Negative     Health Maintenance Due  Topic Date Due   COVID-19 Vaccine (1 - 2024-25 season) Never done    The following portions of the patient's history were reviewed and updated as appropriate: allergies, current medications, past family history, past medical history, past social history, past surgical history and problem list. Problem list updated.  Objective:   Vitals:   02/27/24 1033  BP: (!) 150/95  Pulse: 66  Weight: 167 lb 12.8 oz (76.1 kg)    Physical Exam Vitals and nursing note reviewed. Exam conducted with a chaperone  present Roddie Mc [place], [place] interpreter, present in room as chaperone.).  Constitutional:      Appearance: Normal appearance.  HENT:     Head: Normocephalic.     Salivary Glands: Right salivary gland is not diffusely enlarged or tender. Left salivary gland is not diffusely enlarged or tender.     Mouth/Throat:     Lips: Pink. No lesions.     Mouth: Mucous membranes are  moist.     Tongue: No lesions. Tongue does not deviate from midline.     Pharynx: Oropharynx is clear. Uvula midline. No oropharyngeal exudate or posterior oropharyngeal erythema.     Tonsils: No tonsillar exudate.  Eyes:     General:        Right eye: No discharge.        Left eye: No discharge.  Neck:     Thyroid: No thyroid mass or thyroid tenderness.     Trachea: Trachea and phonation normal. No tracheal tenderness or tracheal deviation.  Cardiovascular:     Rate and Rhythm: Normal rate and regular rhythm.     Heart sounds: Normal heart sounds, S1 normal and S2 normal.  Pulmonary:     Effort: Pulmonary effort is normal.     Breath sounds: Normal breath sounds and air entry.  Abdominal:     General: Abdomen is flat. Bowel sounds are normal.     Palpations: Abdomen is soft.     Tenderness: There is no abdominal tenderness. There is no guarding or rebound.  Genitourinary:    General: Normal vulva.     Exam position: Lithotomy position.     Pubic Area: No rash or pubic lice.      Tanner stage (genital): 5.     Labia:        Right: No rash, tenderness, lesion or injury.        Left: No rash, tenderness, lesion or injury.      Vagina: Normal. No signs of injury and foreign body. No vaginal discharge, erythema, tenderness, bleeding or lesions.     Cervix: Normal. No cervical motion tenderness, discharge, friability, lesion, erythema, cervical bleeding or eversion.     Uterus: Normal.      Adnexa: Right adnexa normal and left adnexa normal.     Comments: pH<4.5 Lymphadenopathy:     Head:     Right side of head: No submental, submandibular, tonsillar, preauricular or posterior auricular adenopathy.     Left side of head: No submental, submandibular, tonsillar, preauricular or posterior auricular adenopathy.     Cervical: No cervical adenopathy.     Right cervical: No superficial or posterior cervical adenopathy.    Left cervical: No superficial or posterior cervical adenopathy.      Upper Body:     Right upper body: No supraclavicular or axillary adenopathy.     Left upper body: No supraclavicular or axillary adenopathy.     Lower Body: No right inguinal adenopathy. No left inguinal adenopathy.  Skin:    General: Skin is warm and dry.     Findings: No lesion or rash.     Comments: Skin tone appropriate for ethnicity.   Neurological:     Mental Status: She is alert and oriented to person, place, and time.  Psychiatric:        Attention and Perception: Attention and perception normal.        Mood and Affect: Mood and affect normal.        Speech: Speech normal.  Behavior: Behavior normal. Behavior is cooperative.        Thought Content: Thought content normal.     Assessment and Plan:  Faithlyn Recktenwald is a 43 y.o. female [phone] presenting to the [facility] Department for an yearly wellness and contraception visit  1. Family planning (Primary) Contraception counseling: Reviewed options based on patient desire and reproductive life plan. Patient is interested in Oral Contraceptive. This was provided to the patient today.   Risks, benefits, and typical effectiveness rates were reviewed.  Questions were answered.  Written information was also given to the patient to review.    The patient will follow up in  1 years for surveillance.  The patient was told to call with any further questions, or with any concerns about this method of contraception.  Emphasized use of condoms 100% of the time for STI prevention.  Educated on ECP and assessed need for ECP. Patient does not qualify for ECP based on no unprotected sex.   - norethindrone (ORTHO MICRONOR) 0.35 MG tablet; Take 1 tablet (0.35 mg total) by mouth daily.  Dispense: 28 tablet; Refill: 13  2. Well woman exam PAP due in 2026. CBE not performed today. Due in 1 year per ACOG guidelines.  Messaged Joellyn Quails for referral to [facility] for mammogram.  PCP list given and patient encouraged to  establish care.   - Hgb A1c w/o eAG  3. Screening for venereal disease  - Chlamydia/Gonorrhea [facility] - [facility] - WET PREP FOR TRICH, YEAST, CLUE - Syphilis Serology, [facility]  Return in about 1 year (around 02/26/2025) for annual well-woman exam.  No future appointments.  Due to language barrier, a Spanish interpreter Aaron Mose.) was present in person during the history-taking, subsequent discussion, and physical exam with this patient.   Edmonia James, NP

## 2024-03-12 ENCOUNTER — Telehealth: Payer: Self-pay | Admitting: *Deleted

## 2024-09-13 ENCOUNTER — Ambulatory Visit: Payer: Self-pay

## 2024-09-13 VITALS — BP 132/92 | Wt 160.5 lb

## 2024-09-13 DIAGNOSIS — Z3009 Encounter for other general counseling and advice on contraception: Secondary | ICD-10-CM

## 2024-09-13 DIAGNOSIS — Z3041 Encounter for surveillance of contraceptive pills: Secondary | ICD-10-CM

## 2024-09-13 MED ORDER — NORETHINDRONE 0.35 MG PO TABS: 1.0000 | ORAL_TABLET | Freq: Every day | ORAL | Status: AC

## 2024-09-13 NOTE — Progress Notes (Signed)
 OCP refill :voices no concerns .  No missed pills.  #6 pills remaining in last pack.  BP 142/91 dynamap Rechecked manual 132/32 Consulted with E. Holmes,NP regarding BP ok to give refills and counsel on f/u with PCP .  The patient was dispensed Micronor  #8 packs today. I provided counseling today regarding the medication. We discussed the medication, the side effects and when to call clinic. Patient given the opportunity to ask questions. Questions answered.

## 2024-10-15 ENCOUNTER — Telehealth: Payer: Self-pay

## 2024-10-27 ENCOUNTER — Other Ambulatory Visit: Payer: Self-pay | Admitting: Nurse Practitioner

## 2024-10-27 DIAGNOSIS — Z1231 Encounter for screening mammogram for malignant neoplasm of breast: Secondary | ICD-10-CM

## 2024-11-10 ENCOUNTER — Ambulatory Visit
Admission: RE | Admit: 2024-11-10 | Discharge: 2024-11-10 | Disposition: A | Payer: Self-pay | Source: Ambulatory Visit | Attending: Nurse Practitioner | Admitting: Nurse Practitioner

## 2024-11-10 DIAGNOSIS — Z1231 Encounter for screening mammogram for malignant neoplasm of breast: Secondary | ICD-10-CM | POA: Insufficient documentation
